# Patient Record
Sex: Male | Born: 1990 | Race: White | Hispanic: No | Marital: Single | Smoking: Current every day smoker
Health system: Southern US, Community
[De-identification: ages and names within clinical notes are randomized; demographics above are authoritative.]

## PROBLEM LIST (undated history)

## (undated) DIAGNOSIS — S42009A Fracture of unspecified part of unspecified clavicle, initial encounter for closed fracture: Secondary | ICD-10-CM

## (undated) DIAGNOSIS — G43909 Migraine, unspecified, not intractable, without status migrainosus: Secondary | ICD-10-CM

## (undated) DIAGNOSIS — J45909 Unspecified asthma, uncomplicated: Secondary | ICD-10-CM

## (undated) HISTORY — DX: Unspecified asthma, uncomplicated: J45.909

## (undated) HISTORY — DX: Migraine, unspecified, not intractable, without status migrainosus: G43.909

## (undated) HISTORY — PX: FRACTURE SURGERY: SHX138

## (undated) HISTORY — DX: Fracture of unspecified part of unspecified clavicle, initial encounter for closed fracture: S42.009A

---
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
## 2009-01-12 ENCOUNTER — Telehealth: Payer: Self-pay | Admitting: Family Medicine

## 2009-01-12 DIAGNOSIS — S02650A Fracture of angle of mandible, unspecified side, initial encounter for closed fracture: Secondary | ICD-10-CM | POA: Insufficient documentation

## 2009-01-13 ENCOUNTER — Ambulatory Visit: Payer: Self-pay | Admitting: Family Medicine

## 2009-01-14 ENCOUNTER — Ambulatory Visit (HOSPITAL_COMMUNITY): Admission: RE | Admit: 2009-01-14 | Discharge: 2009-01-14 | Payer: Self-pay | Admitting: Otolaryngology

## 2009-01-15 ENCOUNTER — Encounter: Payer: Self-pay | Admitting: Family Medicine

## 2009-04-06 ENCOUNTER — Encounter (INDEPENDENT_AMBULATORY_CARE_PROVIDER_SITE_OTHER): Payer: Self-pay | Admitting: *Deleted

## 2009-04-06 ENCOUNTER — Telehealth (INDEPENDENT_AMBULATORY_CARE_PROVIDER_SITE_OTHER): Payer: Self-pay | Admitting: *Deleted

## 2009-04-06 ENCOUNTER — Ambulatory Visit: Payer: Self-pay | Admitting: Family Medicine

## 2009-04-06 DIAGNOSIS — R112 Nausea with vomiting, unspecified: Secondary | ICD-10-CM

## 2009-04-06 LAB — CONVERTED CEMR LAB
Bilirubin Urine: NEGATIVE
Blood in Urine, dipstick: NEGATIVE
Glucose, Urine, Semiquant: NEGATIVE
Ketones, urine, test strip: NEGATIVE
Nitrite: NEGATIVE
Protein, U semiquant: NEGATIVE
Specific Gravity, Urine: 1.02
Urobilinogen, UA: NEGATIVE
WBC Urine, dipstick: NEGATIVE
pH: 6.5

## 2009-12-25 ENCOUNTER — Ambulatory Visit: Payer: Self-pay | Admitting: Family Medicine

## 2009-12-25 DIAGNOSIS — J45909 Unspecified asthma, uncomplicated: Secondary | ICD-10-CM | POA: Insufficient documentation
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
## 2011-05-24 ENCOUNTER — Emergency Department (HOSPITAL_COMMUNITY): Payer: 59

## 2011-05-24 ENCOUNTER — Emergency Department (HOSPITAL_COMMUNITY)
Admission: EM | Admit: 2011-05-24 | Discharge: 2011-05-24 | Disposition: A | Discharge status: Home / Self Care | Payer: 59 | Attending: Emergency Medicine | Admitting: Emergency Medicine

## 2011-05-24 DIAGNOSIS — F17200 Nicotine dependence, unspecified, uncomplicated: Secondary | ICD-10-CM | POA: Insufficient documentation

## 2011-05-24 DIAGNOSIS — S0100XA Unspecified open wound of scalp, initial encounter: Secondary | ICD-10-CM | POA: Insufficient documentation

## 2011-05-24 DIAGNOSIS — S62309A Unspecified fracture of unspecified metacarpal bone, initial encounter for closed fracture: Secondary | ICD-10-CM | POA: Insufficient documentation

## 2011-05-24 DIAGNOSIS — IMO0002 Reserved for concepts with insufficient information to code with codable children: Secondary | ICD-10-CM | POA: Insufficient documentation

## 2011-05-24 DIAGNOSIS — T07XXXA Unspecified multiple injuries, initial encounter: Secondary | ICD-10-CM | POA: Insufficient documentation

## 2011-05-26 ENCOUNTER — Ambulatory Visit (HOSPITAL_BASED_OUTPATIENT_CLINIC_OR_DEPARTMENT_OTHER)
Admission: RE | Admit: 2011-05-26 | Discharge: 2011-05-26 | Disposition: A | Discharge status: Home / Self Care | Payer: Managed Care, Other (non HMO) | Source: Ambulatory Visit | Attending: Orthopedic Surgery | Admitting: Orthopedic Surgery

## 2011-05-26 DIAGNOSIS — S62233A Other displaced fracture of base of first metacarpal bone, unspecified hand, initial encounter for closed fracture: Secondary | ICD-10-CM | POA: Insufficient documentation

## 2011-05-26 DIAGNOSIS — J45909 Unspecified asthma, uncomplicated: Secondary | ICD-10-CM | POA: Insufficient documentation

## 2011-05-26 DIAGNOSIS — F17200 Nicotine dependence, unspecified, uncomplicated: Secondary | ICD-10-CM | POA: Insufficient documentation

## 2011-05-26 DIAGNOSIS — Z01812 Encounter for preprocedural laboratory examination: Secondary | ICD-10-CM | POA: Insufficient documentation

## 2011-05-26 LAB — POCT HEMOGLOBIN-HEMACUE: Hemoglobin: 13.5 g/dL (ref 13.0–17.0)
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
## 2012-05-31 ENCOUNTER — Encounter: Payer: Self-pay | Admitting: Family Medicine

## 2012-05-31 ENCOUNTER — Ambulatory Visit (HOSPITAL_BASED_OUTPATIENT_CLINIC_OR_DEPARTMENT_OTHER)
Admission: RE | Admit: 2012-05-31 | Discharge: 2012-05-31 | Disposition: A | Discharge status: Home / Self Care | Payer: 59 | Source: Ambulatory Visit | Attending: Family Medicine | Admitting: Family Medicine

## 2012-05-31 ENCOUNTER — Ambulatory Visit (INDEPENDENT_AMBULATORY_CARE_PROVIDER_SITE_OTHER): Payer: 59 | Admitting: Family Medicine

## 2012-05-31 VITALS — BP 122/88 | HR 105 | Temp 98.9°F | Wt 153.8 lb

## 2012-05-31 DIAGNOSIS — M545 Low back pain, unspecified: Secondary | ICD-10-CM | POA: Insufficient documentation

## 2012-05-31 DIAGNOSIS — M549 Dorsalgia, unspecified: Secondary | ICD-10-CM

## 2012-05-31 MED ORDER — HYDROCODONE-ACETAMINOPHEN 5-500 MG PO TABS: 1.0000 | ORAL_TABLET | Freq: Four times a day (QID) | ORAL | Status: AC | PRN

## 2012-05-31 MED ORDER — CYCLOBENZAPRINE HCL 10 MG PO TABS: 10.0000 mg | ORAL_TABLET | Freq: Three times a day (TID) | ORAL | Status: AC | PRN

## 2012-05-31 NOTE — Patient Instructions (Signed)
Back Pain, Adult Low back pain is very common. About 1 in 5 people have back pain.The cause of low back pain is rarely dangerous. The pain often gets better over time.About half of people with a sudden onset of back pain feel better in just 2 weeks. About 8 in 10 people feel better by 6 weeks.  CAUSES Some common causes of back pain include:  Strain of the muscles or ligaments supporting the spine.   Wear and tear (degeneration) of the spinal discs.   Arthritis.   Direct injury to the back.  DIAGNOSIS Most of the time, the direct cause of low back pain is not known.However, back pain can be treated effectively even when the exact cause of the pain is unknown.Answering your caregiver's questions about your overall health and symptoms is one of the most accurate ways to make sure the cause of your pain is not dangerous. If your caregiver needs more information, he or she may order lab work or imaging tests (X-rays or MRIs).However, even if imaging tests show changes in your back, this usually does not require surgery. HOME CARE INSTRUCTIONS For many people, back pain returns.Since low back pain is rarely dangerous, it is often a condition that people can learn to manageon their own.   Remain active. It is stressful on the back to sit or stand in one place. Do not sit, drive, or stand in one place for more than 30 minutes at a time. Take short walks on level surfaces as soon as pain allows.Try to increase the length of time you walk each day.   Do not stay in bed.Resting more than 1 or 2 days can delay your recovery.   Do not avoid exercise or work.Your body is made to move.It is not dangerous to be active, even though your back may hurt.Your back will likely heal faster if you return to being active before your pain is gone.   Pay attention to your body when you bend and lift. Many people have less discomfortwhen lifting if they bend their knees, keep the load close to their  bodies,and avoid twisting. Often, the most comfortable positions are those that put less stress on your recovering back.   Find a comfortable position to sleep. Use a firm mattress and lie on your side with your knees slightly bent. If you lie on your back, put a pillow under your knees.   Only take over-the-counter or prescription medicines as directed by your caregiver. Over-the-counter medicines to reduce pain and inflammation are often the most helpful.Your caregiver may prescribe muscle relaxant drugs.These medicines help dull your pain so you can more quickly return to your normal activities and healthy exercise.   Put ice on the injured area.   Put ice in a plastic bag.   Place a towel between your skin and the bag.   Leave the ice on for 15 to 20 minutes, 3 to 4 times a day for the first 2 to 3 days. After that, ice and heat may be alternated to reduce pain and spasms.   Ask your caregiver about trying back exercises and gentle massage. This may be of some benefit.   Avoid feeling anxious or stressed.Stress increases muscle tension and can worsen back pain.It is important to recognize when you are anxious or stressed and learn ways to manage it.Exercise is a great option.  SEEK MEDICAL CARE IF:  You have pain that is not relieved with rest or medicine.   You have   pain that does not improve in 1 week.   You have new symptoms.   You are generally not feeling well.  SEEK IMMEDIATE MEDICAL CARE IF:   You have pain that radiates from your back into your legs.   You develop new bowel or bladder control problems.   You have unusual weakness or numbness in your arms or legs.   You develop nausea or vomiting.   You develop abdominal pain.   You feel faint.  Document Released: 09/05/2005 Document Revised: 08/25/2011 Document Reviewed: 01/24/2011 ExitCare Patient Information 2012 [employer]. 

## 2012-06-03 ENCOUNTER — Encounter: Payer: Self-pay | Admitting: Family Medicine
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
## 2013-08-13 ENCOUNTER — Telehealth: Payer: Self-pay | Admitting: Family Medicine

## 2013-08-13 NOTE — Telephone Encounter (Signed)
Benadryl for itching , calamine---- its viral

## 2013-08-13 NOTE — Telephone Encounter (Signed)
Pt states he thinks has the chicken pox. He has boils that started on his head and have spread down to his waist. Offered patient an appointment tomorrow morning. He wants to know if there is anything we can recommend he do until then since it is spreading. Please advise.

## 2013-08-13 NOTE — Telephone Encounter (Signed)
Please advise      KP 

## 2013-08-13 NOTE — Telephone Encounter (Signed)
Patient has been made aware and voiced understanding. He has agreed to use benadryl and calamine and follow up in the morning.      KP

## 2013-08-14 ENCOUNTER — Encounter: Payer: Self-pay | Admitting: Family Medicine

## 2013-08-14 ENCOUNTER — Ambulatory Visit (HOSPITAL_BASED_OUTPATIENT_CLINIC_OR_DEPARTMENT_OTHER)
Admission: RE | Admit: 2013-08-14 | Discharge: 2013-08-14 | Disposition: A | Discharge status: Home / Self Care | Payer: BC Managed Care – PPO | Source: Ambulatory Visit | Attending: Family Medicine | Admitting: Family Medicine

## 2013-08-14 ENCOUNTER — Ambulatory Visit (INDEPENDENT_AMBULATORY_CARE_PROVIDER_SITE_OTHER): Payer: BC Managed Care – PPO | Admitting: Family Medicine

## 2013-08-14 VITALS — BP 120/70 | HR 111 | Temp 100.5°F | Wt 160.5 lb

## 2013-08-14 DIAGNOSIS — R509 Fever, unspecified: Secondary | ICD-10-CM | POA: Insufficient documentation

## 2013-08-14 DIAGNOSIS — R05 Cough: Secondary | ICD-10-CM

## 2013-08-14 DIAGNOSIS — R21 Rash and other nonspecific skin eruption: Secondary | ICD-10-CM

## 2013-08-14 DIAGNOSIS — B019 Varicella without complication: Secondary | ICD-10-CM

## 2013-08-14 DIAGNOSIS — R059 Cough, unspecified: Secondary | ICD-10-CM

## 2013-08-14 LAB — BASIC METABOLIC PANEL
BUN: 11 mg/dL (ref 6–23)
CO2: 27 mEq/L (ref 19–32)
Chloride: 98 mEq/L (ref 96–112)
Creatinine, Ser: 0.9 mg/dL (ref 0.4–1.5)
Sodium: 130 mEq/L — ABNORMAL LOW (ref 135–145)

## 2013-08-14 LAB — HEPATIC FUNCTION PANEL
ALT: 124 U/L — ABNORMAL HIGH (ref 0–53)
Albumin: 3.8 g/dL (ref 3.5–5.2)
Total Protein: 6.6 g/dL (ref 6.0–8.3)

## 2013-08-14 LAB — CBC WITH DIFFERENTIAL/PLATELET
Basophils Relative: 1.6 % (ref 0.0–3.0)
Eosinophils Relative: 0 % (ref 0.0–5.0)
Lymphs Abs: 2.1 10*3/uL (ref 0.7–4.0)
MCHC: 34.1 g/dL (ref 30.0–36.0)
Monocytes Relative: 13.2 % — ABNORMAL HIGH (ref 3.0–12.0)
Neutrophils Relative %: 42.7 % — ABNORMAL LOW (ref 43.0–77.0)
Platelets: 124 10*3/uL — ABNORMAL LOW (ref 150.0–400.0)
RBC: 4.49 Mil/uL (ref 4.22–5.81)
RDW: 13.6 % (ref 11.5–14.6)

## 2013-08-14 MED ORDER — METHYLPREDNISOLONE ACETATE 80 MG/ML IJ SUSP
80.0000 mg | Freq: Once | INTRAMUSCULAR | Status: AC
  Administered 2013-08-14: 80 mg via INTRAMUSCULAR

## 2013-08-14 MED ORDER — PREDNISONE 10 MG PO TABS: ORAL_TABLET | ORAL | Status: DC

## 2013-08-14 NOTE — Progress Notes (Signed)
  Subjective:     [name] Frey is a 22 y.o. male who presents for evaluation of a rash involving the vesicular rash head to feet , front and back. Rash started 4 days ago. Lesions are pink, and blistering in texture. Rash has changed over time. Rash is pruritic. Associated symptoms: arthralgia, cough, decrease in appetite, fever and myalgia. Patient denies: abdominal pain. Patient has not had contacts with similar rash---but his GF may have shingles. Patient has not had new exposures (soaps, lotions, laundry detergents, foods, medications, plants, insects or animals).  The following portions of the patient's history were reviewed and updated as appropriate: allergies, current medications, past family history, past medical history, past social history, past surgical history and problem list.  Review of Systems Pertinent items are noted in HPI.    Objective:    BP 120/70  Pulse 111  Temp(Src) 100.5 F (38.1 C) (Tympanic)  Wt 160 lb 8 oz (72.802 kg)  SpO2 97% General:  alert, cooperative, appears stated age and mild distress  Skin:  vesicles noted on extremities, face and trunk     Assessment:    varicella    Plan:    Medications: steroids: depo medrol and pred taper. verbal and written patient instruction given. Follow up in a few days. ---if symptoms worsen, fevers increases---go to ER

## 2013-08-14 NOTE — Patient Instructions (Signed)
Chickenpox (Varicella)  Chickenpox (Varicella) is a viral infection that is more common in children. It tends to be a mild illness for most healthy children. It can be more severe in:  · Adults.  · Newborns.  · People with immune system problems.  · People receiving cancer treatment.  CAUSES   Chickenpox is caused by a virus called Varicella-Zoster Virus (VZV). VZV causes both chickenpox and shingles. To get chickenpox, a susceptible person (able to catch an infection) must be exposed to either someone with chickenpox or shingles. A person is susceptible if:  · They have not had the infection before.  · They were not immunized against VZV.  · An immunization did not give complete protection against VZV (breakthrough chickenpox).  Chickenpox is very contagious. It is contagious from 1 to 2 days before the rash appears. It is also contagious until the blisters are crusted. The blisters usually become crusted 3 to 7 days after the rash begins. It usually takes about 2 weeks before symptoms show up.  SYMPTOMS   Typical chickenpox symptoms include:  · Fever.  · Headache.  · Poor appetite.  · An itchy rash that changes over time:  · It starts as red spots that become bumps.  · Bumps become blisters.  · Blisters turn into scabs.  Breakthrough chickenpox happens when an immunized person still gets chickenpox. The symptoms are less severe. The rash may only be red spots or bumps, with no blisters or scabs. Fever may be low or absent.  DIAGNOSIS   Typical chickenpox is diagnosed by physical exam. A blood test can confirm the diagnosis, when the disease is not certain.  TREATMENT   Most of the time, in healthy children, only home treatments are needed. In some cases, in the early stages of chickenpox, your caregiver may prescribe antiviral medicines. These medicines may decrease the severity of the illness and prevent complications. In the rare complicated case, treatment in the hospital is needed. Intravenous (IV) medicine  and other treatments can be given in the hospital. Your caregiver may prescribe medicine to relieve itching.  To prevent the spread of chickenpox to at risk people, your caregiver may prescribe:  · Immunization.  · Antiviral medicine.  · Immune globulin.  HOME CARE INSTRUCTIONS   · For fever:  · Do not give aspirin to children. This could lead to brain and liver damage through Reye's syndrome. Read the label on over-the-counter medicines used.  · Only take over-the-counter or prescription medicines for pain, discomfort or fever as directed by your caregiver.  · For itching:  · If your caregiver prescribed medicine, take as directed.  · You may use plain calamine lotion on the itching sores. Follow the directions on the label. Do not use on sores in the mouth.  · Avoid scratching the rash or picking off the scabs. Keep fingernails cut short and clean. Put cotton gloves or socks on your child's hands at night.  · Keep a child with chickenpox quiet and cool. Sweating and overheating makes itching worse. Stay out of the sun.  · Cool compresses may be applied to itchy areas.  · Cool water baths with baking soda or oatmeal soap may help.  · For painful sores in the mouth; pain medicine and cold foods, like frozen pops, may feel good.  · Drink plenty of fluids. Avoid salty or acidic liquids (tomato or orange juice). These irritate mouth sores and cause pain.  · People with chickenpox should avoid exposure (being   in the same room) with:  · Pregnant women (especially if they have not had chickenpox or been immunized against it).  · Young infants.  · People receiving cancer treatments or long-term steroids.  · People with immune system problems.  · The elderly.  · Any child or adult with chickenpox should stay home until all blisters have crusted. If there are no blisters, the child or adult should stay home until no new spots show up.  SEEK MEDICAL CARE IF:   · You or your child has an oral temperature above 102° F (38.9°  C).  · Your baby is older than 3 months with a rectal temperature of 100.5° F (38.1° C) or higher for more than 1 day.  · The sores are infected. Look for:  · Swelling.  · Increasing redness.  · Red streaks.  · Tenderness.  · Yellow or green pus coming from blisters.  · Cough.  · New symptoms develop that concern you.  SEEK IMMEDIATE MEDICAL CARE IF:   You or your child develops:  · Vomiting.  · Confusion, unusual sleepiness or odd behavior.  · Neck stiffness.  · Seizures (convulsions).  · Loss of balance.  · Chest pain.  · Trouble breathing or fast breathing.  · Blood in urine.  · Rectal bleeding.  · Bruising of the skin or bleeding in the blisters.  · Blisters in the eye.  · Eye pain, redness or decreased vision.  · You or your child has an oral temperature above 102° F (38.9° C), not controlled by medicine.  · Your baby is older than 3 months with a rectal temperature of 102° F (38.9° C) or higher.  · Your baby is 3 months old or younger with a rectal temperature of 100.4° F (38° C) or higher.  MAKE SURE YOU:   · Understand these instructions.  · Will watch your condition.  · Will get help right away if you are not doing well or get worse.  Document Released: 09/02/2000 Document Revised: 11/28/2011 Document Reviewed: 03/20/2008  Exit[employer]® Patient Information ©2014 [employer].

## 2013-08-14 NOTE — Progress Notes (Signed)
Pre visit review using our clinic review tool, if applicable. No additional management support is needed unless otherwise documented below in the visit note. 

## 2013-08-16 ENCOUNTER — Other Ambulatory Visit: Payer: Self-pay

## 2013-08-16 DIAGNOSIS — D7289 Other specified disorders of white blood cells: Secondary | ICD-10-CM

## 2013-08-21 ENCOUNTER — Other Ambulatory Visit (INDEPENDENT_AMBULATORY_CARE_PROVIDER_SITE_OTHER): Payer: BC Managed Care – PPO

## 2013-08-21 DIAGNOSIS — D7289 Other specified disorders of white blood cells: Secondary | ICD-10-CM

## 2013-08-21 LAB — CBC WITH DIFFERENTIAL/PLATELET
HCT: 45 % (ref 39.0–52.0)
Hemoglobin: 15.1 g/dL (ref 13.0–17.0)
Lymphocytes Relative: 30.3 % (ref 12.0–46.0)
MCHC: 33.6 g/dL (ref 30.0–36.0)
Monocytes Absolute: 1.1 10*3/uL — ABNORMAL HIGH (ref 0.1–1.0)
RBC: 4.87 Mil/uL (ref 4.22–5.81)
RDW: 13.8 % (ref 11.5–14.6)

## 2013-08-21 LAB — HEPATIC FUNCTION PANEL
ALT: 43 U/L (ref 0–53)
Bilirubin, Direct: 0 mg/dL (ref 0.0–0.3)

## 2016-06-28 ENCOUNTER — Emergency Department (HOSPITAL_BASED_OUTPATIENT_CLINIC_OR_DEPARTMENT_OTHER)
Admission: EM | Admit: 2016-06-28 | Discharge: 2016-06-28 | Disposition: A | Discharge status: Home / Self Care | Payer: Self-pay | Attending: Emergency Medicine | Admitting: Emergency Medicine

## 2016-06-28 ENCOUNTER — Encounter (HOSPITAL_BASED_OUTPATIENT_CLINIC_OR_DEPARTMENT_OTHER): Payer: Self-pay | Admitting: *Deleted

## 2016-06-28 ENCOUNTER — Emergency Department (HOSPITAL_BASED_OUTPATIENT_CLINIC_OR_DEPARTMENT_OTHER): Payer: Self-pay

## 2016-06-28 DIAGNOSIS — M79672 Pain in left foot: Secondary | ICD-10-CM

## 2016-06-28 DIAGNOSIS — S93401A Sprain of unspecified ligament of right ankle, initial encounter: Secondary | ICD-10-CM | POA: Insufficient documentation

## 2016-06-28 DIAGNOSIS — F17210 Nicotine dependence, cigarettes, uncomplicated: Secondary | ICD-10-CM | POA: Insufficient documentation

## 2016-06-28 DIAGNOSIS — Y929 Unspecified place or not applicable: Secondary | ICD-10-CM | POA: Insufficient documentation

## 2016-06-28 DIAGNOSIS — X501XXA Overexertion from prolonged static or awkward postures, initial encounter: Secondary | ICD-10-CM | POA: Insufficient documentation

## 2016-06-28 DIAGNOSIS — J45909 Unspecified asthma, uncomplicated: Secondary | ICD-10-CM | POA: Insufficient documentation

## 2016-06-28 DIAGNOSIS — L03116 Cellulitis of left lower limb: Secondary | ICD-10-CM | POA: Insufficient documentation

## 2016-06-28 DIAGNOSIS — Y999 Unspecified external cause status: Secondary | ICD-10-CM | POA: Insufficient documentation

## 2016-06-28 DIAGNOSIS — Y9301 Activity, walking, marching and hiking: Secondary | ICD-10-CM | POA: Insufficient documentation

## 2016-06-28 MED ORDER — SULFAMETHOXAZOLE-TRIMETHOPRIM 800-160 MG PO TABS: 1.0000 | ORAL_TABLET | Freq: Two times a day (BID) | ORAL | 0 refills | Status: AC

## 2016-06-28 MED ORDER — CEPHALEXIN 500 MG PO CAPS: 500.0000 mg | ORAL_CAPSULE | Freq: Three times a day (TID) | ORAL | 0 refills | Status: AC

## 2016-06-28 NOTE — ED Notes (Signed)
Patient transported to X-ray 
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
## 2016-07-30 ENCOUNTER — Encounter (HOSPITAL_COMMUNITY): Payer: Self-pay | Admitting: Emergency Medicine

## 2016-07-30 ENCOUNTER — Emergency Department (HOSPITAL_COMMUNITY): Payer: Self-pay

## 2016-07-30 ENCOUNTER — Emergency Department (HOSPITAL_COMMUNITY)
Admission: EM | Admit: 2016-07-30 | Discharge: 2016-07-30 | Disposition: A | Discharge status: Home / Self Care | Payer: Self-pay | Attending: Emergency Medicine | Admitting: Emergency Medicine

## 2016-07-30 DIAGNOSIS — F17210 Nicotine dependence, cigarettes, uncomplicated: Secondary | ICD-10-CM | POA: Insufficient documentation

## 2016-07-30 DIAGNOSIS — S0083XA Contusion of other part of head, initial encounter: Secondary | ICD-10-CM

## 2016-07-30 DIAGNOSIS — Y999 Unspecified external cause status: Secondary | ICD-10-CM | POA: Insufficient documentation

## 2016-07-30 DIAGNOSIS — R42 Dizziness and giddiness: Secondary | ICD-10-CM | POA: Insufficient documentation

## 2016-07-30 DIAGNOSIS — Y939 Activity, unspecified: Secondary | ICD-10-CM | POA: Insufficient documentation

## 2016-07-30 DIAGNOSIS — J45909 Unspecified asthma, uncomplicated: Secondary | ICD-10-CM | POA: Insufficient documentation

## 2016-07-30 DIAGNOSIS — Y929 Unspecified place or not applicable: Secondary | ICD-10-CM | POA: Insufficient documentation

## 2016-07-30 DIAGNOSIS — S0012XA Contusion of left eyelid and periocular area, initial encounter: Secondary | ICD-10-CM | POA: Insufficient documentation

## 2016-07-30 NOTE — ED Notes (Signed)
Patient denies pain and is resting comfortably.  
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
## 2016-07-30 NOTE — ED Notes (Signed)
Pupils are equal and reactive to light and accommodation.
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
## 2020-02-06 ENCOUNTER — Emergency Department (HOSPITAL_COMMUNITY): Payer: Self-pay

## 2020-02-06 ENCOUNTER — Emergency Department (HOSPITAL_COMMUNITY)
Admission: EM | Admit: 2020-02-06 | Discharge: 2020-02-06 | Disposition: A | Discharge status: Home / Self Care | Payer: Self-pay | Attending: Emergency Medicine | Admitting: Emergency Medicine

## 2020-02-06 ENCOUNTER — Encounter (HOSPITAL_COMMUNITY): Payer: Self-pay | Admitting: Emergency Medicine

## 2020-02-06 ENCOUNTER — Other Ambulatory Visit: Payer: Self-pay

## 2020-02-06 DIAGNOSIS — R112 Nausea with vomiting, unspecified: Secondary | ICD-10-CM | POA: Insufficient documentation

## 2020-02-06 DIAGNOSIS — Z79899 Other long term (current) drug therapy: Secondary | ICD-10-CM | POA: Insufficient documentation

## 2020-02-06 DIAGNOSIS — J45909 Unspecified asthma, uncomplicated: Secondary | ICD-10-CM | POA: Insufficient documentation

## 2020-02-06 DIAGNOSIS — R079 Chest pain, unspecified: Secondary | ICD-10-CM | POA: Insufficient documentation

## 2020-02-06 DIAGNOSIS — R197 Diarrhea, unspecified: Secondary | ICD-10-CM | POA: Insufficient documentation

## 2020-02-06 DIAGNOSIS — F17210 Nicotine dependence, cigarettes, uncomplicated: Secondary | ICD-10-CM | POA: Insufficient documentation

## 2020-02-06 LAB — COMPREHENSIVE METABOLIC PANEL
ALT: 34 U/L (ref 0–44)
AST: 28 U/L (ref 15–41)
Albumin: 4.5 g/dL (ref 3.5–5.0)
Alkaline Phosphatase: 52 U/L (ref 38–126)
Anion gap: 9 (ref 5–15)
BUN: 11 mg/dL (ref 6–20)
CO2: 23 mmol/L (ref 22–32)
Calcium: 9.1 mg/dL (ref 8.9–10.3)
Chloride: 106 mmol/L (ref 98–111)
Creatinine, Ser: 0.89 mg/dL (ref 0.61–1.24)
GFR calc Af Amer: >60 mL/min (ref >60)
GFR calc non Af Amer: >60 mL/min (ref >60)
Glucose, Bld: 103 mg/dL — ABNORMAL HIGH (ref 70–99)
Potassium: 4.2 mmol/L (ref 3.5–5.1)
Sodium: 138 mmol/L (ref 135–145)
Total Bilirubin: 0.6 mg/dL (ref 0.3–1.2)
Total Protein: 7.3 g/dL (ref 6.5–8.1)

## 2020-02-06 LAB — URINALYSIS, ROUTINE W REFLEX MICROSCOPIC
Bilirubin Urine: NEGATIVE
Glucose, UA: NEGATIVE mg/dL
Hgb urine dipstick: NEGATIVE
Ketones, ur: NEGATIVE mg/dL
Leukocytes,Ua: NEGATIVE
Nitrite: NEGATIVE
Protein, ur: NEGATIVE mg/dL
Specific Gravity, Urine: 1.02 (ref 1.005–1.030)
pH: 7 (ref 5.0–8.0)

## 2020-02-06 LAB — CBC
HCT: 42.4 % (ref 39.0–52.0)
Hemoglobin: 14.1 g/dL (ref 13.0–17.0)
MCH: 31.2 pg (ref 26.0–34.0)
MCHC: 33.3 g/dL (ref 30.0–36.0)
MCV: 93.8 fL (ref 80.0–100.0)
Platelets: 317 10*3/uL (ref 150–400)
RBC: 4.52 MIL/uL (ref 4.22–5.81)
RDW: 13 % (ref 11.5–15.5)
WBC: 8 10*3/uL (ref 4.0–10.5)
nRBC: 0 % (ref 0.0–0.2)

## 2020-02-06 LAB — LIPASE, BLOOD: Lipase: 27 U/L (ref 11–51)

## 2020-02-06 MED ORDER — SODIUM CHLORIDE 0.9 % IV BOLUS
1000.0000 mL | Freq: Once | INTRAVENOUS | Status: AC
  Administered 2020-02-06: 1000 mL via INTRAVENOUS

## 2020-02-06 MED ORDER — FAMOTIDINE 20 MG PO TABS: 20.0000 mg | ORAL_TABLET | Freq: Two times a day (BID) | ORAL | 0 refills | Status: DC

## 2020-02-06 MED ORDER — DICYCLOMINE HCL 10 MG/ML IM SOLN
20.0000 mg | Freq: Once | INTRAMUSCULAR | Status: AC
  Administered 2020-02-06: 20 mg via INTRAMUSCULAR
  Filled 2020-02-06: qty 2

## 2020-02-06 MED ORDER — SODIUM CHLORIDE 0.9% FLUSH
3.0000 mL | Freq: Once | INTRAVENOUS | Status: AC
  Administered 2020-02-06: 3 mL via INTRAVENOUS

## 2020-02-06 MED ORDER — ONDANSETRON 4 MG PO TBDP: ORAL_TABLET | ORAL | 0 refills | Status: DC

## 2020-02-06 MED ORDER — DICYCLOMINE HCL 20 MG PO TABS: 20.0000 mg | ORAL_TABLET | Freq: Two times a day (BID) | ORAL | 0 refills | Status: DC

## 2020-02-06 MED ORDER — ONDANSETRON HCL 4 MG/2ML IJ SOLN
4.0000 mg | Freq: Once | INTRAMUSCULAR | Status: AC
  Administered 2020-02-06: 4 mg via INTRAVENOUS
  Filled 2020-02-06: qty 2

## 2020-02-06 MED ORDER — FAMOTIDINE IN NACL 20-0.9 MG/50ML-% IV SOLN
20.0000 mg | Freq: Once | INTRAVENOUS | Status: AC
  Administered 2020-02-06: 20 mg via INTRAVENOUS
  Filled 2020-02-06: qty 50
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
## 2020-02-06 NOTE — ED Notes (Signed)
Pt given urinal and told we need a urine sample.

## 2020-02-06 NOTE — Discharge Instructions (Signed)
Your work-up today is very reassuring and symptoms are likely due to foodborne illness or a viral GI bug.  Use prescribed medications to help with vomiting, stomach irritation and pain.  Zofran -  as needed for nausea and vomiting Pepcid - twice daily to help with acid reduction and stomach irritation Bentyl - as needed for abdominal cramping  Start with clear liquids today, and advance your diet slowly to bland foods.  Often times the stomach bugs are short-lived and hopefully will be feeling better in the next 2 days.  If you develop persistent or worsening symptoms, develop focal abdominal pain, fevers or any other new or concerning symptoms return for reevaluation.

## 2020-02-06 NOTE — ED Provider Notes (Signed)
[facility]-EMERGENCY DEPT Provider Note   CSN: [id number] Arrival date & time: 02/06/20  1223     History Chief Complaint  Patient presents with  . Emesis  . Diarrhea  . Chest Pain    [name] Frey is a 29 y.o. male.  [name] Frey is a 29 y.o. male with history of asthma, and migraines, who presents to the ED for evaluation of nausea, vomiting, and diarrhea that began at 10 AM this morning.  He reports he woke up, initially drank a cup of coffee and was feeling fine and then started to feel very poorly.  He reports associated chills and multiple episodes of nonbloody emesis.  He has had a few loose stools, has not noted any blood in the stool.  He reports intermittent cramping abdominal discomfort as well as some epigastric discomfort that intermittently radiates up into his chest.  He reports chest discomfort started after patient had multiple episodes of dry heaving, he reports this is now primarily resolved.  No associated shortness of breath.  He has not had any fevers or chills.  He reports he has been unable to eat last night and his roommate reported he was having some upset stomach and not feeling well also, patient wonders whether he could have food poisoning.  He denies any other known sick contacts.  Has not taken any medicine prior to arrival.  No other aggravating or alleviating factors.        Past Medical History:  Diagnosis Date  . Asthma   . Collar bone fracture   . Migraines     Patient Active Problem List   Diagnosis Date Noted  . ASTHMA 12/25/2009  . NAUSEA AND VOMITING 04/06/2009  . CLOSED FRACTURE OF ANGLE OF JAW 01/12/2009  . MIGRAINE, COMMON 09/10/2007    Past Surgical History:  Procedure Laterality Date  . FRACTURE SURGERY         Family History  Problem Relation Age of Onset  . Hypertension Mother   . Other Brother        Epilepsy  . Sleep apnea Father     Social History   Tobacco Use  . Smoking status: Current  Every Day Smoker    Packs/day: 0.50    Years: 3.00    Pack years: 1.50    Types: Cigarettes  . Smokeless tobacco: Never Used  Substance Use Topics  . Alcohol use: Yes  . Drug use: Yes    Types: Marijuana    Comment: opiates    Home Medications Prior to Admission medications   Medication Sig Start Date End Date Taking? Authorizing Provider  acetaminophen (TYLENOL) 500 MG tablet Take 500-1,000 mg by mouth as needed for mild pain or headache.   Yes [provider]  diphenhydrAMINE (BENADRYL) 25 MG tablet Take 25 mg by mouth every 6 (six) hours as needed.   Yes [provider]  PARoxetine (PAXIL) 20 MG tablet Take 20 mg by mouth daily. 01/13/20  Yes [provider]  dicyclomine (BENTYL) 20 MG tablet Take 1 tablet (20 mg total) by mouth 2 (two) times daily. 02/06/20   Dartha Lodge, PA-C  famotidine (PEPCID) 20 MG tablet Take 1 tablet (20 mg total) by mouth 2 (two) times daily. 02/06/20   Dartha Lodge, PA-C  ondansetron (ZOFRAN ODT) 4 MG disintegrating tablet 4mg  ODT q4 hours prn nausea/vomit 02/06/20   02/08/20, PA-C  predniSONE (DELTASONE) 10 MG tablet 3 po qd for 3 days then  2 po qd for 3 days the 1 po qd for 3 days Patient not taking: Reported on 02/06/2020 08/14/13   Donato Schultz, DO    Allergies    Patient has no known allergies.  Review of Systems   Review of Systems  Constitutional: Negative for chills and fever.  HENT: Negative.   Respiratory: Negative for cough and shortness of breath.   Cardiovascular: Positive for chest pain.  Gastrointestinal: Positive for abdominal pain, diarrhea, nausea and vomiting. Negative for blood in stool and constipation.  Genitourinary: Negative for dysuria and frequency.  Musculoskeletal: Negative for arthralgias and myalgias.  Skin: Negative for color change and rash.  Neurological: Negative for dizziness, syncope, light-headedness and headaches.  All other systems reviewed and are  negative.   Physical Exam Updated Vital Signs BP (!) 134/96   Pulse 75   Temp 97.8 F (36.6 C) (Oral)   Resp 13   SpO2 96%   Physical Exam Vitals and nursing note reviewed.  Constitutional:      General: He is not in acute distress.    Appearance: He is well-developed. He is not ill-appearing or diaphoretic.  HENT:     Head: Normocephalic and atraumatic.  Eyes:     General:        Right eye: No discharge.        Left eye: No discharge.     Pupils: Pupils are equal, round, and reactive to light.  Cardiovascular:     Rate and Rhythm: Normal rate and regular rhythm.     Pulses:          Radial pulses are 2+ on the right side and 2+ on the left side.     Heart sounds: Normal heart sounds. No murmur. No friction rub. No gallop.   Pulmonary:     Effort: Pulmonary effort is normal. No respiratory distress.     Breath sounds: Normal breath sounds. No wheezing or rales.     Comments: Respirations equal and unlabored, patient able to speak in full sentences, lungs clear to auscultation bilaterally Abdominal:     General: Bowel sounds are normal. There is no distension.     Palpations: Abdomen is soft. There is no mass.     Tenderness: There is no abdominal tenderness. There is no guarding.     Comments: Abdomen soft, nondistended, nontender to palpation in all quadrants without guarding or peritoneal signs  Musculoskeletal:        General: No deformity.     Cervical back: Neck supple.     Right lower leg: No edema.  Skin:    General: Skin is warm and dry.     Capillary Refill: Capillary refill takes less than 2 seconds.  Neurological:     Mental Status: He is alert.     Coordination: Coordination normal.     Comments: Speech is clear, able to follow commands Moves extremities without ataxia, coordination intact  Psychiatric:        Mood and Affect: Mood normal.        Behavior: Behavior normal.     ED Results / Procedures / Treatments   Labs (all labs ordered are  listed, but only abnormal results are displayed) Labs Reviewed  COMPREHENSIVE METABOLIC PANEL - Abnormal; Notable for the following components:      Result Value   Glucose, Bld 103 (*)    All other components within normal limits  LIPASE, BLOOD  CBC  URINALYSIS, ROUTINE W REFLEX MICROSCOPIC  EKG None  Radiology DG Chest Port 1 View  Result Date: 02/06/2020 CLINICAL DATA:  Chest pain beginning today with headache. EXAM: PORTABLE CHEST 1 VIEW COMPARISON:  08/14/2013 FINDINGS: Lungs are adequately inflated and otherwise clear. Cardiomediastinal silhouette and remainder of the exam is unchanged. IMPRESSION: No active disease. Electronically Signed   By: Marin Olp M.D.   On: 02/06/2020 14:00    Procedures Procedures (including critical care time)  Medications Ordered in ED Medications  sodium chloride flush (NS) 0.9 % injection 3 mL (3 mLs Intravenous Given 02/06/20 1302)  sodium chloride 0.9 % bolus 1,000 mL (0 mLs Intravenous Stopped 02/06/20 1447)  ondansetron (ZOFRAN) injection 4 mg (4 mg Intravenous Given 02/06/20 1317)  famotidine (PEPCID) IVPB 20 mg premix (0 mg Intravenous Stopped 02/06/20 1447)  dicyclomine (BENTYL) injection 20 mg (20 mg Intramuscular Given 02/06/20 1319)    ED Course  I have reviewed the triage vital signs and the nursing notes.  Pertinent labs & imaging results that were available during my care of the patient were reviewed by me and considered in my medical decision making (see chart for details).    MDM Rules/Calculators/A&P                      Patient presents to the ED with complaints of nausea, vomiting, diarrhea with some intermittent abdominal pains.  Reports after an episode of dry heaving he had some chest pain that has now almost completely resolved.  Patient nontoxic appearing, in no apparent distress, vitals WNL. On exam abdomen is nontender to palpation, no peritoneal signs. Will evaluate with labs and given reported chest pain will get  EKG and chest x-ray but do not think any abdominal imaging is indicated given reassuring exam.  Zofran, Pepcid, Bentyl and fluids administered.   I have independently ordered, reviewed and interpreted all labs and imaging: CBC: No leukocytosis, normal hemoglobin CMP: Glucose 103, no significant electrolyte derangements, normal renal and liver function Lipase: WNL UA: No hematuria or signs of infection EKG: Sinus rhythm without ischemic changes or arrhythmia CXR: No active cardiopulmonary disease  On repeat abdominal exam patient remains without peritoneal signs, doubt cholecystitis, pancreatitis, diverticulitis, appendicitis, bowel obstruction/perforation. Patient tolerating PO in the emergency department. Will discharge home with supportive measures. I discussed results, treatment plan, need for PCP follow-up, and return precautions with the patient. Provided opportunity for questions, patient confirmed understanding and is in agreement with plan.    Final Clinical Impression(s) / ED Diagnoses Final diagnoses:  Nausea vomiting and diarrhea    Rx / DC Orders ED Discharge Orders         Ordered    ondansetron (ZOFRAN ODT) 4 MG disintegrating tablet     02/06/20 1443    famotidine (PEPCID) 20 MG tablet  2 times daily     02/06/20 1443    dicyclomine (BENTYL) 20 MG tablet  2 times daily     02/06/20 1443           Jacqlyn Larsen, [place] 02/07/20 7824    Varney Biles, MD 02/09/20 2252

## 2020-02-21 ENCOUNTER — Emergency Department (HOSPITAL_COMMUNITY): Payer: Self-pay

## 2020-02-21 ENCOUNTER — Encounter (HOSPITAL_COMMUNITY): Payer: Self-pay

## 2020-02-21 ENCOUNTER — Other Ambulatory Visit: Payer: Self-pay

## 2020-02-21 DIAGNOSIS — R0789 Other chest pain: Secondary | ICD-10-CM | POA: Insufficient documentation

## 2020-02-21 DIAGNOSIS — F17210 Nicotine dependence, cigarettes, uncomplicated: Secondary | ICD-10-CM | POA: Insufficient documentation

## 2020-02-21 DIAGNOSIS — J45909 Unspecified asthma, uncomplicated: Secondary | ICD-10-CM | POA: Insufficient documentation

## 2020-02-21 DIAGNOSIS — R252 Cramp and spasm: Secondary | ICD-10-CM | POA: Insufficient documentation

## 2020-02-21 LAB — CBC
HCT: 43.9 % (ref 39.0–52.0)
Hemoglobin: 14.7 g/dL (ref 13.0–17.0)
MCH: 30.9 pg (ref 26.0–34.0)
MCHC: 33.5 g/dL (ref 30.0–36.0)
MCV: 92.2 fL (ref 80.0–100.0)
Platelets: 358 10*3/uL (ref 150–400)
RBC: 4.76 MIL/uL (ref 4.22–5.81)
RDW: 12.9 % (ref 11.5–15.5)
WBC: 6.8 10*3/uL (ref 4.0–10.5)
nRBC: 0 % (ref 0.0–0.2)

## 2020-02-21 LAB — BASIC METABOLIC PANEL
Anion gap: 10 (ref 5–15)
BUN: 12 mg/dL (ref 6–20)
CO2: 26 mmol/L (ref 22–32)
Calcium: 9.2 mg/dL (ref 8.9–10.3)
Chloride: 100 mmol/L (ref 98–111)
Creatinine, Ser: 0.94 mg/dL (ref 0.61–1.24)
GFR calc Af Amer: >60 mL/min (ref >60)
GFR calc non Af Amer: >60 mL/min (ref >60)
Glucose, Bld: 90 mg/dL (ref 70–99)
Potassium: 3.9 mmol/L (ref 3.5–5.1)
Sodium: 136 mmol/L (ref 135–145)

## 2020-02-21 LAB — TROPONIN I (HIGH SENSITIVITY)
Troponin I (High Sensitivity): <2 ng/L (ref <18)
Troponin I (High Sensitivity): <2 ng/L (ref <18)

## 2020-02-21 MED ORDER — SODIUM CHLORIDE 0.9% FLUSH: 3.0000 mL | Freq: Once | INTRAVENOUS | Status: DC

## 2020-02-21 NOTE — ED Triage Notes (Addendum)
Pt reports chest pain, cramping, SOB and shaking starting yesterday. 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 Denies vomiting. He states that he also had his first COVID vaccine on Tuesday and recently started lisinopril.

## 2020-02-22 ENCOUNTER — Emergency Department (HOSPITAL_COMMUNITY)
Admission: EM | Admit: 2020-02-22 | Discharge: 2020-02-22 | Disposition: A | Discharge status: Home / Self Care | Payer: Self-pay | Attending: Emergency Medicine | Admitting: Emergency Medicine

## 2020-02-22 DIAGNOSIS — R0789 Other chest pain: Secondary | ICD-10-CM

## 2020-02-22 DIAGNOSIS — R252 Cramp and spasm: Secondary | ICD-10-CM

## 2020-02-22 LAB — HEPATIC FUNCTION PANEL
ALT: 28 U/L (ref 0–44)
AST: 26 U/L (ref 15–41)
Albumin: 4.6 g/dL (ref 3.5–5.0)
Alkaline Phosphatase: 60 U/L (ref 38–126)
Bilirubin, Direct: <0.1 mg/dL (ref 0.0–0.2)
Total Bilirubin: 0.4 mg/dL (ref 0.3–1.2)
Total Protein: 7.6 g/dL (ref 6.5–8.1)

## 2020-02-22 LAB — D-DIMER, QUANTITATIVE: D-Dimer, Quant: 0.31 ug/mL-FEU (ref 0.00–0.50)

## 2020-02-22 LAB — LIPASE, BLOOD: Lipase: 27 U/L (ref 11–51)

## 2020-02-22 LAB — CK: Total CK: 158 U/L (ref 49–397)

## 2020-02-22 LAB — MAGNESIUM: Magnesium: 2.4 mg/dL (ref 1.7–2.4)

## 2020-02-22 MED ORDER — IBUPROFEN 800 MG PO TABS
800.0000 mg | ORAL_TABLET | Freq: Once | ORAL | Status: AC
  Administered 2020-02-22: 800 mg via ORAL
  Filled 2020-02-22: qty 1

## 2020-02-22 NOTE — ED Notes (Signed)
Pt tolerating PO fluids

## 2020-02-22 NOTE — ED Provider Notes (Signed)
TIME SEEN: 12:39 AM  CHIEF COMPLAINT: Chest pain, abdominal pain, back pain  HPI: Patient is a 29 year old male with history of migraines, asthma who presents to the emergency department with a tense, tight feeling in his chest, upper abdomen and back that started this morning when he woke up.  States he initially felt like he was "stiff".  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  He states he did increase his fluid intake.  He denies any fevers, cough, nausea, vomiting or diarrhea.  He states his pain has improved but still having some chest discomfort.  He did have a fracture of the lower extremity and surgery about 3 months ago.  No history of PE or DVT.  No aggravating or alleviating factors to his pain.  He states he was also concerned this could have been related to starting lisinopril recently and having his first Covid vaccination a couple of days ago.  ROS: See HPI Constitutional: no fever  Eyes: no drainage  ENT: no runny nose   Cardiovascular:   chest pain  Resp: no SOB  GI: no vomiting GU: no dysuria Integumentary: no rash  Allergy: no hives  Musculoskeletal: no leg swelling  Neurological: no slurred speech ROS otherwise negative  PAST MEDICAL HISTORY/PAST SURGICAL HISTORY:  Past Medical History:  Diagnosis Date  . Asthma   . Collar bone fracture   . Migraines     MEDICATIONS:  Prior to Admission medications   Medication Sig Start Date End Date Taking? Authorizing Provider  acetaminophen (TYLENOL) 500 MG tablet Take 500-1,000 mg by mouth as needed for mild pain or headache.    [provider]  dicyclomine (BENTYL) 20 MG tablet Take 1 tablet (20 mg total) by mouth 2 (two) times daily. 02/06/20   Dartha Lodge, PA-C  diphenhydrAMINE (BENADRYL) 25 MG tablet Take 25 mg by mouth every 6 (six) hours as needed.    [provider]  famotidine (PEPCID) 20 MG tablet Take 1 tablet (20  mg total) by mouth 2 (two) times daily. 02/06/20   Dartha Lodge, PA-C  ondansetron (ZOFRAN ODT) 4 MG disintegrating tablet 4mg  ODT q4 hours prn nausea/vomit 02/06/20   02/08/20, PA-C  PARoxetine (PAXIL) 20 MG tablet Take 20 mg by mouth daily. 01/13/20   [provider]  predniSONE (DELTASONE) 10 MG tablet 3 po qd for 3 days then 2 po qd for 3 days the 1 po qd for 3 days Patient not taking: Reported on 02/06/2020 08/14/13   08/16/13, DO    ALLERGIES:  No Known Allergies  SOCIAL HISTORY:  Social History   Tobacco Use  . Smoking status: Current Every Day Smoker    Packs/day: 0.50    Years: 3.00    Pack years: 1.50    Types: Cigarettes  . Smokeless tobacco: Never Used  Substance Use Topics  . Alcohol use: Yes    FAMILY HISTORY: Family History  Problem Relation Age of Onset  . Hypertension Mother   . Other Brother        Epilepsy  . Sleep apnea Father     EXAM: BP (!) 144/91 (BP Location: Right Arm)   Pulse 77   Temp 98.7 F (37.1 C)   Resp 18   SpO2 100%  CONSTITUTIONAL: Alert and oriented and responds appropriately to questions. Well-appearing; well-nourished HEAD: Normocephalic EYES: Conjunctivae clear, pupils appear equal, EOM  appear intact ENT: normal nose; moist mucous membranes NECK: Supple, normal ROM CARD: RRR; S1 and S2 appreciated; no murmurs, no clicks, no rubs, no gallops RESP: Normal chest excursion without splinting or tachypnea; breath sounds clear and equal bilaterally; no wheezes, no rhonchi, no rales, no hypoxia or respiratory distress, speaking full sentences ABD/GI: Normal bowel sounds; non-distended; soft, non-tender, no rebound, no guarding, no peritoneal signs, no hepatosplenomegaly BACK:  The back appears normal EXT: Normal ROM in all joints; no deformity noted, no edema; no cyanosis, no calf tenderness or calf swelling SKIN: Normal color for age and race; warm; no rash on exposed skin NEURO: Moves all extremities  equally PSYCH: The patient's mood and manner are appropriate.   MEDICAL DECISION MAKING: Patient here with chest pain, back pain, abdominal pain that has improved.  Still having some chest discomfort.  His EKG has been reviewed/interpreted and shows no ischemia, arrhythmia or interval change.  Labs obtained in triage reviewed/interpreted and show no acute abnormality including normal hemoglobin, electrolytes and to normal high-sensitivity troponins.  Low suspicion that this is related to his COVID-19 vaccination or lisinopril.  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  Have offered IV fluids but patient would prefer oral fluids which I feel is reasonable given he is not vomiting or having diarrhea.  Abdominal exam today is benign.  Doubt appendicitis, cholecystitis, colitis, diverticulitis, bowel obstruction, perforation.  No urinary symptoms and no back pain currently.  Doubt kidney stone, pyelonephritis, UTI.  Will obtain LFTs, lipase, CK level,.  He is also at risk for PE given recent fracture and surgery.  Will obtain D-dimer.  ED PROGRESS: Patient's labs appear reassuring.  Normal potassium and magnesium.  D-dimer negative.  LFTs and lipase normal.  CK level normal.  Given ibuprofen for discomfort he states he is feeling better.  Have encouraged him to still get his second Covid vaccine and continue his lisinopril as prescribed.  Recommended increase fluid intake at home.  I feel he is safe for discharge.   At this time, I do not feel there is any life-threatening condition present. I have reviewed, interpreted and discussed all results (EKG, imaging, lab, urine as appropriate) and exam findings with patient/family. I have reviewed nursing notes and appropriate previous records.  I feel the patient is safe to be discharged home without further emergent workup and can continue workup as an outpatient as needed. Discussed usual  and customary return precautions. Patient/family verbalize understanding and are comfortable with this plan.  Outpatient follow-up has been provided as needed. All questions have been answered.     EKG Interpretation  Date/Time:  Friday February 21 2020 19:28:36 EDT Ventricular Rate:  91 PR Interval:    QRS Duration: 89 QT Interval:  359 QTC Calculation: 442 R Axis:   81 Text Interpretation: Sinus rhythm No significant change since last tracing Confirmed by Pryor Curia [id number]) on 02/22/2020 12:39:07 AM         Court Joy was evaluated in Emergency Department on 02/22/2020 for the symptoms described in the history of present illness. He was evaluated in the context of the global COVID-19 pandemic, which necessitated consideration that the patient might be at risk for infection with the SARS-CoV-2 virus that causes COVID-19. Institutional protocols and algorithms that pertain to the evaluation of patients at risk for COVID-19 are in a state of rapid change based on information released by regulatory bodies including  the [employer] and federal and state organizations. These policies and algorithms were followed during the patient's care in the ED.      Fallon Haecker, Layla Maw, DO 02/22/20 [phone]

## 2020-02-22 NOTE — Discharge Instructions (Addendum)
Your labs, chest x-ray, EKG today were reassuring.  I recommend increase fluid intake at home.  You may alternate Tylenol and ibuprofen as needed for pain.

## 2020-02-29 ENCOUNTER — Other Ambulatory Visit: Payer: Self-pay

## 2020-02-29 ENCOUNTER — Encounter (HOSPITAL_COMMUNITY): Payer: Self-pay | Admitting: *Deleted

## 2020-02-29 ENCOUNTER — Emergency Department (HOSPITAL_COMMUNITY)
Admission: EM | Admit: 2020-02-29 | Discharge: 2020-03-01 | Disposition: A | Discharge status: Home / Self Care | Payer: Medicaid Other | Attending: Emergency Medicine | Admitting: Emergency Medicine

## 2020-02-29 DIAGNOSIS — Z20822 Contact with and (suspected) exposure to covid-19: Secondary | ICD-10-CM | POA: Insufficient documentation

## 2020-02-29 DIAGNOSIS — F17210 Nicotine dependence, cigarettes, uncomplicated: Secondary | ICD-10-CM | POA: Insufficient documentation

## 2020-02-29 DIAGNOSIS — F1910 Other psychoactive substance abuse, uncomplicated: Secondary | ICD-10-CM | POA: Diagnosis present

## 2020-02-29 DIAGNOSIS — F19959 Other psychoactive substance use, unspecified with psychoactive substance-induced psychotic disorder, unspecified: Secondary | ICD-10-CM | POA: Diagnosis present

## 2020-02-29 DIAGNOSIS — F32A Depression, unspecified: Secondary | ICD-10-CM

## 2020-02-29 DIAGNOSIS — R440 Auditory hallucinations: Secondary | ICD-10-CM

## 2020-02-29 DIAGNOSIS — J45909 Unspecified asthma, uncomplicated: Secondary | ICD-10-CM | POA: Insufficient documentation

## 2020-02-29 DIAGNOSIS — F332 Major depressive disorder, recurrent severe without psychotic features: Secondary | ICD-10-CM | POA: Insufficient documentation

## 2020-02-29 NOTE — ED Triage Notes (Signed)
When asked why the patient is here he says he "is not really sure". When asked about SI, says that he "feels that way from time to time", denies specific means. Denies HI. Says voices "are telling me really bad things". Reports he "is probably not taking" them like he suppose to.

## 2020-02-29 NOTE — ED Triage Notes (Signed)
Pt belongings removed, changed in to scrubs, wanded by security, ambulatory to treatment room.

## 2020-03-01 DIAGNOSIS — F19959 Other psychoactive substance use, unspecified with psychoactive substance-induced psychotic disorder, unspecified: Secondary | ICD-10-CM | POA: Diagnosis present

## 2020-03-01 DIAGNOSIS — F1910 Other psychoactive substance abuse, uncomplicated: Secondary | ICD-10-CM | POA: Diagnosis present

## 2020-03-01 LAB — CBC
HCT: 42.5 % (ref 39.0–52.0)
Hemoglobin: 14.5 g/dL (ref 13.0–17.0)
MCH: 31.5 pg (ref 26.0–34.0)
MCHC: 34.1 g/dL (ref 30.0–36.0)
MCV: 92.4 fL (ref 80.0–100.0)
Platelets: 334 10*3/uL (ref 150–400)
RBC: 4.6 MIL/uL (ref 4.22–5.81)
RDW: 13 % (ref 11.5–15.5)
WBC: 9.8 10*3/uL (ref 4.0–10.5)
nRBC: 0 % (ref 0.0–0.2)

## 2020-03-01 LAB — ACETAMINOPHEN LEVEL: Acetaminophen (Tylenol), Serum: <10 ug/mL — ABNORMAL LOW (ref 10–30)

## 2020-03-01 LAB — RAPID URINE DRUG SCREEN, HOSP PERFORMED
Amphetamines: NOT DETECTED
Barbiturates: NOT DETECTED
Benzodiazepines: NOT DETECTED
Cocaine: NOT DETECTED
Opiates: NOT DETECTED
Tetrahydrocannabinol: NOT DETECTED

## 2020-03-01 LAB — COMPREHENSIVE METABOLIC PANEL
ALT: 34 U/L (ref 0–44)
AST: 26 U/L (ref 15–41)
Albumin: 4.7 g/dL (ref 3.5–5.0)
Alkaline Phosphatase: 58 U/L (ref 38–126)
Anion gap: 15 (ref 5–15)
BUN: 5 mg/dL — ABNORMAL LOW (ref 6–20)
CO2: 21 mmol/L — ABNORMAL LOW (ref 22–32)
Calcium: 9 mg/dL (ref 8.9–10.3)
Chloride: 102 mmol/L (ref 98–111)
Creatinine, Ser: 0.99 mg/dL (ref 0.61–1.24)
GFR calc Af Amer: >60 mL/min (ref >60)
GFR calc non Af Amer: >60 mL/min (ref >60)
Glucose, Bld: 97 mg/dL (ref 70–99)
Potassium: 3.7 mmol/L (ref 3.5–5.1)
Sodium: 138 mmol/L (ref 135–145)
Total Bilirubin: 0.9 mg/dL (ref 0.3–1.2)
Total Protein: 7.8 g/dL (ref 6.5–8.1)

## 2020-03-01 LAB — ETHANOL: Alcohol, Ethyl (B): <10 mg/dL (ref <10)

## 2020-03-01 LAB — SARS CORONAVIRUS 2 BY RT PCR (HOSPITAL ORDER, PERFORMED IN ~~LOC~~ HOSPITAL LAB): SARS Coronavirus 2: NEGATIVE

## 2020-03-01 LAB — SALICYLATE LEVEL: Salicylate Lvl: <7 mg/dL — ABNORMAL LOW (ref 7.0–30.0)

## 2020-03-01 MED ORDER — NICOTINE 21 MG/24HR TD PT24
21.0000 mg | MEDICATED_PATCH | Freq: Once | TRANSDERMAL | Status: DC
  Administered 2020-03-01: 21 mg via TRANSDERMAL
  Filled 2020-03-01: qty 1

## 2020-03-01 NOTE — BH Assessment (Signed)
Tele Assessment Note   Patient Name: [name] Frey MRN: [id number] Referring Physician: Dr. Charlann Lange, PA-C Location of Patient: [facility] Location of Provider: [place] is an 29 y.o. male presenting with SI with plan. When asked about suicidal plan, patient reported "I really change it up", then stated "a plan I am not sure". When asked onset of SI patient stated "29 years old". During assessment patient was not forthcoming with information. Patient reported being homeless. Patient reported using alcohol, unable to share how amount. Patient does request "mental health". Patient denied prior suicide attempts. Patient admitted to burning himself, no specifics given. Patient reported not receiving any outpatient mental health services at this times. 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 Patient reported having no access to guns or weapons. Patient reported lack of family support. Patient denied HI and psychosis, however EDP reported auditory hallucinations. Patient was calm and cooperative during assessment.   Diagnosis: Major depressive disorder  Past Medical History:  Past Medical History:  Diagnosis Date  . Asthma   . Collar bone fracture   . Migraines     Past Surgical History:  Procedure Laterality Date  . FRACTURE SURGERY      Family History:  Family History  Problem Relation Age of Onset  . Hypertension Mother   . Other Brother        Epilepsy  . Sleep apnea Father     Social History:  reports that he has been smoking cigarettes. He has a 1.50 pack-year smoking history. He has never used smokeless tobacco. He reports current alcohol use. He reports current drug use. Drug: Marijuana.  Additional Social History:  Alcohol / Drug Use Pain Medications: see MAR Prescriptions: see MAR Over the Counter: see MAR  CIWA: CIWA-Ar BP: (!) 161/112 Pulse Rate: 97 COWS:    Allergies: No  Known Allergies  Home Medications: (Not in a hospital admission)   OB/GYN Status:  No LMP for male patient.  General Assessment Data Location of Assessment: [facility] ED TTS Assessment: In system Is this a Tele or Face-to-Face Assessment?: Tele Assessment Is this an Initial Assessment or a Re-assessment for this encounter?: Initial Assessment Patient Accompanied by:: N/A Language Other than English: No Living Arrangements: Homeless/Shelter What gender do you identify as?: Male Marital status: Single Pregnancy Status: No Living Arrangements: Alone Can pt return to current living arrangement?: Yes Admission Status: Voluntary Is patient capable of signing voluntary admission?: Yes Referral Source: Self/Family/Friend     Crisis Care Plan Living Arrangements: Alone Legal Guardian:  (self) Name of Psychiatrist:  (none) Name of Therapist:  (none)  Education Status Is patient currently in school?: No Is the patient employed, unemployed or receiving disability?: Unemployed  Risk to self with the past 6 months Suicidal Ideation: Yes-Currently Present Has patient been a risk to self within the past 6 months prior to admission? : Yes Suicidal Intent: Yes-Currently Present Has patient had any suicidal intent within the past 6 months prior to admission? : Yes Is patient at risk for suicide?: Yes Suicidal Plan?: Yes-Currently Present Has patient had any suicidal plan within the past 6 months prior to admission? : Yes Specify Current Suicidal Plan:  (refused to share) Access to Means: Yes Specify Access to Suicidal Means:  (refused to share) What has been your use of drugs/alcohol within the last 12 months?:  (alcohol) Previous Attempts/Gestures: No How many times?:  (0) Other Self Harm Risks:  (none) Triggers for  Past Attempts: None known Intentional Self Injurious Behavior: Burning Comment - Self Injurious Behavior:  (amt of times unknown) Family Suicide History: Unknown Recent  stressful life event(s):  ("I don't know") Persecutory voices/beliefs?: No Depression: Yes Depression Symptoms: Feeling worthless/self pity, Guilt Substance abuse history and/or treatment for substance abuse?: No Suicide prevention information given to non-admitted patients: Not applicable  Risk to Others within the past 6 months Homicidal Ideation: No Does patient have any lifetime risk of violence toward others beyond the six months prior to admission? : No Thoughts of Harm to Others: No Current Homicidal Intent: No Current Homicidal Plan: No Access to Homicidal Means: No Identified Victim:  (n/a) History of harm to others?: No Assessment of Violence: None Noted Violent Behavior Description:  (none) Does patient have access to weapons?: No Criminal Charges Pending?: No Does patient have a court date: No Is patient on probation?: No  Psychosis Hallucinations: None noted Delusions: None noted  Mental Status Report Appearance/Hygiene: Unremarkable Eye Contact: Fair Motor Activity: Freedom of movement Speech: Logical/coherent Level of Consciousness: Alert Mood: Depressed Affect: Appropriate to circumstance Anxiety Level: Minimal Thought Processes: Coherent, Relevant Judgement: Impaired Orientation: Unable to assess Obsessive Compulsive Thoughts/Behaviors: None  Cognitive Functioning Concentration: Poor Memory: Recent Impaired Is patient IDD: No Insight: Poor Impulse Control: Poor Appetite: Good Have you had any weight changes? : No Change Sleep: No Change Total Hours of Sleep:  ("I don't know") Vegetative Symptoms: Unable to Assess  ADLScreening [facility] Assessment Services) Patient's cognitive ability adequate to safely complete daily activities?: Yes Patient able to express need for assistance with ADLs?: Yes Independently performs ADLs?: Yes (appropriate for developmental age)  Prior Inpatient Therapy Prior Inpatient Therapy: No  Prior Outpatient  Therapy Prior Outpatient Therapy: No Does patient have an ACCT team?: No Does patient have Intensive In-House Services?  : No Does patient have [facility] services? : No Does patient have P4CC services?: No  ADL Screening (condition at time of admission) Patient's cognitive ability adequate to safely complete daily activities?: Yes Patient able to express need for assistance with ADLs?: Yes Independently performs ADLs?: Yes (appropriate for developmental age) Merchant [employer] officer (For Healthcare) Does Patient Have a Medical Advance Directive?: No   Disposition:  Disposition Initial Assessment Completed for this Encounter: Yes  Nira Conn, NP, recommends overnight observation due to safety and stabilization with psych reassessment in the AM.   This service was provided via telemedicine using a 2-way, interactive audio and video technology.  Names of all persons participating in this telemedicine service and their role in this encounter. Name: Nasean Zapf Role: Patient  Name: Al Corpus Role: TTS Clinician  Name:  Role:   Name:  Role:     Burnetta Sabin 03/01/2020 3:52 AM

## 2020-03-01 NOTE — ED Provider Notes (Signed)
[facility]-EMERGENCY DEPT Provider Note   CSN: [id number] Arrival date & time: 02/29/20  2152     History Chief Complaint  Patient presents with   Suicidal    [name] Frey is a 29 y.o. male.  Patient to ED with vague symptoms of depression and auditory hallucinations. He does not give details. He reports SI off and on but none at this time. No HI. He expresses that he needs help for a "mental health" condition.  The history is provided by the patient. No language interpreter was used.       Past Medical History:  Diagnosis Date   Asthma    Collar bone fracture    Migraines     Patient Active Problem List   Diagnosis Date Noted   ASTHMA 12/25/2009   NAUSEA AND VOMITING 04/06/2009   CLOSED FRACTURE OF ANGLE OF JAW 01/12/2009   MIGRAINE, COMMON 09/10/2007    Past Surgical History:  Procedure Laterality Date   FRACTURE SURGERY         Family History  Problem Relation Age of Onset   Hypertension Mother    Other Brother        Epilepsy   Sleep apnea Father     Social History   Tobacco Use   Smoking status: Current Every Day Smoker    Packs/day: 0.50    Years: 3.00    Pack years: 1.50    Types: Cigarettes   Smokeless tobacco: Never Used  Substance Use Topics   Alcohol use: Yes   Drug use: Yes    Types: Marijuana    Comment: opiates    Home Medications Prior to Admission medications   Medication Sig Start Date End Date Taking? Authorizing Provider  acetaminophen (TYLENOL) 500 MG tablet Take 500-1,000 mg by mouth every 6 (six) hours as needed for mild pain or headache.    Yes [provider]  lisinopril (ZESTRIL) 10 MG tablet Take 10 mg by mouth daily.   Yes [provider]  PARoxetine (PAXIL) 20 MG tablet Take 20 mg by mouth daily. 01/13/20  Yes [provider]  dicyclomine (BENTYL) 20 MG tablet Take 1 tablet (20 mg total) by mouth 2 (two) times daily. Patient not taking: Reported on  02/29/2020 02/06/20   Dartha Lodge, PA-C  famotidine (PEPCID) 20 MG tablet Take 1 tablet (20 mg total) by mouth 2 (two) times daily. Patient not taking: Reported on 02/29/2020 02/06/20   Dartha Lodge, PA-C  ondansetron (ZOFRAN ODT) 4 MG disintegrating tablet 4mg  ODT q4 hours prn nausea/vomit Patient not taking: Reported on 02/29/2020 02/06/20   02/08/20, PA-C  predniSONE (DELTASONE) 10 MG tablet 3 po qd for 3 days then 2 po qd for 3 days the 1 po qd for 3 days Patient not taking: Reported on 02/06/2020 08/14/13   08/16/13, DO    Allergies    Patient has no known allergies.  Review of Systems   Review of Systems  Constitutional: Negative for chills and fever.  HENT: Negative.   Respiratory: Negative.   Cardiovascular: Negative.   Gastrointestinal: Negative.   Musculoskeletal: Negative.   Skin: Negative.   Neurological: Negative.   Psychiatric/Behavioral:       See HPI.    Physical Exam Updated Vital Signs BP (!) 161/112 (BP Location: Right Arm)    Pulse 97    Temp 99.1 F (37.3 C)    Resp 16    SpO2 98%  Physical Exam Constitutional:      Appearance: He is well-developed.  HENT:     Head: Normocephalic.  Cardiovascular:     Rate and Rhythm: Normal rate and regular rhythm.  Pulmonary:     Effort: Pulmonary effort is normal.     Breath sounds: Normal breath sounds.  Abdominal:     General: Bowel sounds are normal.     Palpations: Abdomen is soft.     Tenderness: There is no abdominal tenderness. There is no guarding or rebound.  Musculoskeletal:        General: Normal range of motion.     Cervical back: Normal range of motion and neck supple.  Skin:    General: Skin is warm and dry.     Findings: No rash.  Neurological:     Mental Status: He is alert and oriented to person, place, and time.  Psychiatric:        Attention and Perception: He perceives auditory hallucinations.        Mood and Affect: Affect is blunt.        Speech: Speech normal.          Behavior: Behavior is withdrawn. Behavior is cooperative.        Thought Content: Thought content does not include homicidal or suicidal ideation.     ED Results / Procedures / Treatments   Labs (all labs ordered are listed, but only abnormal results are displayed) Labs Reviewed  COMPREHENSIVE METABOLIC PANEL - Abnormal; Notable for the following components:      Result Value   CO2 21 (*)    BUN 5 (*)    All other components within normal limits  SALICYLATE LEVEL - Abnormal; Notable for the following components:   Salicylate Lvl <7.0 (*)    All other components within normal limits  ACETAMINOPHEN LEVEL - Abnormal; Notable for the following components:   Acetaminophen (Tylenol), Serum <10 (*)    All other components within normal limits  ETHANOL  CBC  RAPID URINE DRUG SCREEN, HOSP PERFORMED   Results for orders placed or performed during the hospital encounter of 02/29/20  Comprehensive metabolic panel  Result Value Ref Range   Sodium 138 135 - 145 mmol/L   Potassium 3.7 3.5 - 5.1 mmol/L   Chloride 102 98 - 111 mmol/L   CO2 21 (L) 22 - 32 mmol/L   Glucose, Bld 97 70 - 99 mg/dL   BUN 5 (L) 6 - 20 mg/dL   Creatinine, Ser 1.01 0.61 - 1.24 mg/dL   Calcium 9.0 8.9 - 75.1 mg/dL   Total Protein 7.8 6.5 - 8.1 g/dL   Albumin 4.7 3.5 - 5.0 g/dL   AST 26 15 - 41 U/L   ALT 34 0 - 44 U/L   Alkaline Phosphatase 58 38 - 126 U/L   Total Bilirubin 0.9 0.3 - 1.2 mg/dL   GFR calc non Af Amer >60 >60 mL/min   GFR calc Af Amer >60 >60 mL/min   Anion gap 15 5 - 15  Ethanol  Result Value Ref Range   Alcohol, Ethyl (B) <10 <10 mg/dL  Salicylate level  Result Value Ref Range   Salicylate Lvl <7.0 (L) 7.0 - 30.0 mg/dL  Acetaminophen level  Result Value Ref Range   Acetaminophen (Tylenol), Serum <10 (L) 10 - 30 ug/mL  cbc  Result Value Ref Range   WBC 9.8 4.0 - 10.5 K/uL   RBC 4.60 4.22 - 5.81 MIL/uL   Hemoglobin 14.5 13.0 -  17.0 g/dL   HCT 42.5 39 - 52 %   MCV 92.4 80.0 - 100.0  fL   MCH 31.5 26.0 - 34.0 pg   MCHC 34.1 30.0 - 36.0 g/dL   RDW 13.0 11.5 - 15.5 %   Platelets 334 150 - 400 K/uL   nRBC 0.0 0.0 - 0.2 %     EKG None  Radiology No results found.  Procedures Procedures (including critical care time)  Medications Ordered in ED Medications - No data to display  ED Course  I have reviewed the triage vital signs and the nursing notes.  Pertinent labs & imaging results that were available during my care of the patient were reviewed by me and considered in my medical decision making (see chart for details).    MDM Rules/Calculators/A&P                          Patient to ED requesting help with "mental health". Vague historian, does not seem to want to provide details. States SI "sometimes", not currently. No HI. He endorses auditory hallucinations but does not elaborate.   He is consideration medically cleared for TTS consultation.  Final Clinical Impression(s) / ED Diagnoses Final diagnoses:  None   1. Auditory hallucinations 2. Depression  Rx / DC Orders ED Discharge Orders    None       Dennie Bible 03/01/20 0706    Molpus, Jenny Reichmann, MD 03/01/20 [phone]

## 2020-03-01 NOTE — [facility] Suicide Risk Assessment (Cosign Needed)
Suicide Risk Assessment   [facility] Discharge Suicide Risk Assessment   Principal Problem: Psychoactive substance-induced psychosis (HCC) Discharge Diagnoses: Principal Problem:   Psychoactive substance-induced psychosis (HCC) Active Problems:   Dextromethorphan use disorder, mild, abuse (HCC)   Total Time spent with patient: 45 minutes  Musculoskeletal: Strength & Muscle Tone: within normal limits Gait & Station: normal Patient leans: N/A  Psychiatric Specialty Exam: @ROSBYAGE @  Blood pressure 133/77, pulse (!) 103, temperature 98.1 F (36.7 C), temperature source Oral, resp. rate 18, SpO2 99 %.There is no height or weight on file to calculate BMI.  General Appearance: Casual  Eye Contact::  Good  Speech:  Normal Rate409  Volume:  Normal  Mood:  Anxious  Affect:  Blunt  Thought Process:  Coherent and Descriptions of Associations: Intact  Orientation:  Full (Time, Place, and Person)  Thought Content:  WDL and Logical  Suicidal Thoughts:  No  Homicidal Thoughts:  No  Memory:  Immediate;   Good Recent;   Good Remote;   Good  Judgement:  Fair  Insight:  Fair  Psychomotor Activity:  Normal  Concentration:  Good  Recall:  Good  Fund of Knowledge:Fair  Language: Good  Akathisia:  No  Handed:  Right  AIMS (if indicated):     Assets:  Leisure Time Physical Health Resilience Social Support  Sleep:     Cognition: WNL  ADL's:  Intact   Mental Status Per Nursing Assessment::   On Admission:   High on dextromethamorphone (DMX) with hallucinations.  Patient reported using DMX (dextromethamorphone) yesterday a box of 16 to get high.  Clean for 60 days prior to this incident.  Attending 12-step program and staying at [facility] of [facility].  Stabilized on Paxil and lisinopril.  No psychosis, clear and coherent at this time.  No suicidal/homicidal ideations, hallucinations, or withdrawal symptoms.  Psychiatrically stable for discharge, peer supper at [phone].  Demographic  Factors:  Male and Caucasian  Loss Factors: NA  Historical Factors: NA  Risk Reduction Factors:   Sense of responsibility to family, Living with another person, especially a relative, Positive social support and Positive therapeutic relationship  Continued Clinical Symptoms:  Anxiety, mild  Cognitive Features That Contribute To Risk:  None    Suicide Risk:  Minimal: No identifiable suicidal ideation.  Patients presenting with no risk factors but with morbid ruminations; may be classified as minimal risk based on the severity of the depressive symptoms   Plan Of Care/Follow-up recommendations:  Dextromethamorphone abuse, mild: -Continue with 12-step program -Return to Sober Living Activity:  as tolerated Diet:  heart healthy diet  [phone], NP 03/01/2020, 12:59 PM

## 2020-03-01 NOTE — ED Notes (Signed)
Spoke with Randa Evens, [facility], patient vitals include 99.1 temp and BP 161/112. New vitals, UDS needed and COVID test. Per Marchelle Folks, RN, they will complete new vitals, UDS and COVID screening.

## 2020-03-01 NOTE — ED Notes (Signed)
Sitter at bedside.

## 2020-03-01 NOTE — ED Notes (Signed)
TTS assessment completed.  Nira Conn, NP, recommends overnight observation due to safety and stabilization with psych reassessment in the AM.

## 2020-03-01 NOTE — Consult Note (Addendum)
Patient reported using DMX (dextromethamorphone) yesterday a box of 16 to get high.  Clean for 60 days prior to this incident.  Attending 12-step program and staying at [facility] of [place].  Stabilized on Paxil and lisinopril.  No psychosis, clear and coherent at this time.  No suicidal/homicidal ideations, hallucinations, or withdrawal symptoms.  Psychiatrically stable for discharge, peer supper at [phone].  Nanine Means, PMHNP  Patient seen face-to-face for psychiatric evaluation, chart reviewed and case discussed with the physician extender and developed treatment plan. Reviewed the information documented and agree with the treatment plan. Thedore Mins, MD

## 2020-03-01 NOTE — Discharge Instructions (Signed)
Peer Support at [phone]

## 2020-03-20 ENCOUNTER — Other Ambulatory Visit: Payer: Self-pay

## 2020-03-20 ENCOUNTER — Emergency Department (HOSPITAL_COMMUNITY)
Admission: EM | Admit: 2020-03-20 | Discharge: 2020-03-20 | Disposition: A | Discharge status: Home / Self Care | Payer: Medicaid Other | Attending: Emergency Medicine | Admitting: Emergency Medicine

## 2020-03-20 ENCOUNTER — Encounter (HOSPITAL_COMMUNITY): Payer: Self-pay

## 2020-03-20 DIAGNOSIS — J45909 Unspecified asthma, uncomplicated: Secondary | ICD-10-CM | POA: Insufficient documentation

## 2020-03-20 DIAGNOSIS — R21 Rash and other nonspecific skin eruption: Secondary | ICD-10-CM | POA: Insufficient documentation

## 2020-03-20 DIAGNOSIS — H539 Unspecified visual disturbance: Secondary | ICD-10-CM | POA: Insufficient documentation

## 2020-03-20 DIAGNOSIS — F17210 Nicotine dependence, cigarettes, uncomplicated: Secondary | ICD-10-CM | POA: Insufficient documentation

## 2020-03-20 MED ORDER — HYDROCORTISONE 1 % EX CREA: TOPICAL_CREAM | CUTANEOUS | 0 refills | Status: AC

## 2020-03-20 MED ORDER — FLUORESCEIN SODIUM 1 MG OP STRP
1.0000 | ORAL_STRIP | Freq: Once | OPHTHALMIC | Status: DC
  Filled 2020-03-20: qty 1

## 2020-03-20 MED ORDER — TETRACAINE HCL 0.5 % OP SOLN
2.0000 [drp] | Freq: Once | OPHTHALMIC | Status: DC
  Filled 2020-03-20: qty 4

## 2020-03-20 NOTE — ED Provider Notes (Signed)
[facility]-EMERGENCY DEPT Provider Note   CSN: [id number] Arrival date & time: 03/20/20  1137     History Chief Complaint  Patient presents with  . Rash    [name] Frey is a 29 y.o. male.  HPI  Patient is a 29 year old male with a history of asthma and migraines presenting today with rash as chief complaint.  Patient states that he has a painful not itchy rash to the neck, left ankle, right foot and right arm states he also has an area under his left eye.  He states that he first noticed the symptoms 2 days ago.  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  Patient states he also has noticed some left eye blurriness for the past few days.  He states he has no eye pain, discharge or redness.    Past Medical History:  Diagnosis Date  . Asthma   . Collar bone fracture   . Migraines     Patient Active Problem List   Diagnosis Date Noted  . Psychoactive substance-induced psychosis (HCC) 03/01/2020  . Dextromethorphan use disorder, mild, abuse (HCC) 03/01/2020  . ASTHMA 12/25/2009  . NAUSEA AND VOMITING 04/06/2009  . CLOSED FRACTURE OF ANGLE OF JAW 01/12/2009  . MIGRAINE, COMMON 09/10/2007    Past Surgical History:  Procedure Laterality Date  . FRACTURE SURGERY         Family History  Problem Relation Age of Onset  . Hypertension Mother   . Other Brother        Epilepsy  . Sleep apnea Father     Social History   Tobacco Use  . Smoking status: Current Every Day Smoker    Packs/day: 0.50    Years: 3.00    Pack years: 1.50    Types: Cigarettes  . Smokeless tobacco: Never Used  Vaping Use  . Vaping Use: Never used  Substance Use Topics  . Alcohol use: Yes  . Drug use: Yes    Types: Marijuana    Comment: not currently using heroin    Home Medications Prior to Admission medications   Medication Sig Start Date End Date Taking? Authorizing Provider    hydrocortisone cream 1 % Apply to affected area 2 times daily DO NOT APPLY TO FACE 03/20/20   Eulanda Dorion, Stevphen Meuse S, PA  lisinopril (ZESTRIL) 10 MG tablet Take 10 mg by mouth daily.    [provider]  PARoxetine (PAXIL) 20 MG tablet Take 20 mg by mouth daily. 01/13/20   [provider]    Allergies    Patient has no known allergies.  Review of Systems   Review of Systems  Constitutional: Negative for fever.  HENT: Negative for congestion.   Eyes: Positive for visual disturbance. Negative for pain and redness.  Respiratory: Negative for shortness of breath.   Cardiovascular: Negative for chest pain.  Gastrointestinal: Negative for abdominal distention.  Skin: Positive for rash.  Neurological: Negative for dizziness and headaches.    Physical Exam Updated Vital Signs BP (!) 142/85 (BP Location: Right Arm)   Pulse 95   Temp 98.1 F (36.7 C) (Oral)   Resp 16   Ht 5\' 11"  (1.803 m)   Wt 90.7 kg   SpO2 100%   BMI 27.89 kg/m   Physical Exam Vitals and nursing note reviewed.  Constitutional:      General: He is not in acute distress.  Appearance: Normal appearance. He is not ill-appearing.  HENT:     Head: Normocephalic and atraumatic.     Mouth/Throat:     Mouth: Mucous membranes are moist.  Eyes:     General: No scleral icterus.       Right eye: No discharge.        Left eye: No discharge.     Extraocular Movements: Extraocular movements intact.     Conjunctiva/sclera: Conjunctivae normal.     Pupils: Pupils are equal, round, and reactive to light.     Comments: Fluorescein eye exam with no evidence of abrasion. Sclera is normal.  Pupils are normal.  See technician note for visual acuity. Mild vision differences.   Pulmonary:     Effort: Pulmonary effort is normal.     Breath sounds: No stridor.  Skin:    General: Skin is warm and dry.     Capillary Refill: Capillary refill takes less than 2 seconds.     Findings: Rash present.     Comments: Patient  has erythematous, nonpruritic, nonblanching maculopapular rash to his right neck, left inferior eyelid, right foot and left ankle. No vesicles.  Neurological:     Mental Status: He is alert and oriented to person, place, and time. Mental status is at baseline.           ED Results / Procedures / Treatments   Labs (all labs ordered are listed, but only abnormal results are displayed) Labs Reviewed - No data to display  EKG None  Radiology No results found.  Procedures Procedures (including critical care time)  Medications Ordered in ED Medications  tetracaine (PONTOCAINE) 0.5 % ophthalmic solution 2 drop (has no administration in time range)  fluorescein ophthalmic strip 1 strip (has no administration in time range)    ED Course  I have reviewed the triage vital signs and the nursing notes.  Pertinent labs & imaging results that were available during my care of the patient were reviewed by me and considered in my medical decision making (see chart for details).    MDM Rules/Calculators/A&P                          Well-appearing 29 year old male presented today with 2 days of nondescript rash to his right neck, left inferior eyelid, right foot and left ankle.  Physical exam is notable for rash to the above areas.  Fluorescein exam was normal.  Patient has diffuse rash making shingles very unlikely he has no herpetic fluorescein uptake on exam.  No evidence of corneal abrasion.  He is not a contact lens wear.  He does wear glasses however.  No evidence of conjunctivitis.  Likely contact dermatitis.  Questionable blurry vision left eye possibly blepharitis versus over functioning meibomian gland.  Will recommend warm compresses and follow-up with ophthalmology if symptoms continue.  Patient given hydrocortisone cream for nonface rash use.  He will follow-up with his primary care doctor.  I discussed this case with my attending physician who cosigned this note including  patient's presenting symptoms, physical exam, and planned diagnostics and interventions. Attending physician stated agreement with plan or made changes to plan which were implemented.   Patient understanding of plan.  And follow-up.  Final Clinical Impression(s) / ED Diagnoses Final diagnoses:  Rash    Rx / DC Orders ED Discharge Orders         Ordered    hydrocortisone cream 1 %  Discontinue  Reprint     03/20/20 1257           Gailen Shelter, [place] 03/20/20 1300    Pollyann Savoy, MD 03/20/20 1455

## 2020-03-20 NOTE — Discharge Instructions (Addendum)
I have provided you with a steroid cream to use you may apply twice daily.  Do not apply to your face.  You may apply to elsewhere in your body.  I do recommend warm compresses to the eyelids and monitor your symptoms.  If your blurry vision does not improve by Tuesday please call the ophthalmologist whose number of provided you.  You may also take Benadryl 25 mg every 6 hours as needed for symptoms.

## 2020-03-20 NOTE — ED Triage Notes (Signed)
Patient c/o a painful rash to the neck, left ankle, right arm, and under the left eye x 2 days.

## 2020-06-19 IMAGING — CR DG CHEST 2V
2 series · 2 of 2 positions shown · non-contrast
Comparison: 02/06/2020

CLINICAL DATA: Chest pain short of breath

EXAM:
CHEST - 2 VIEW

[w chest pa]
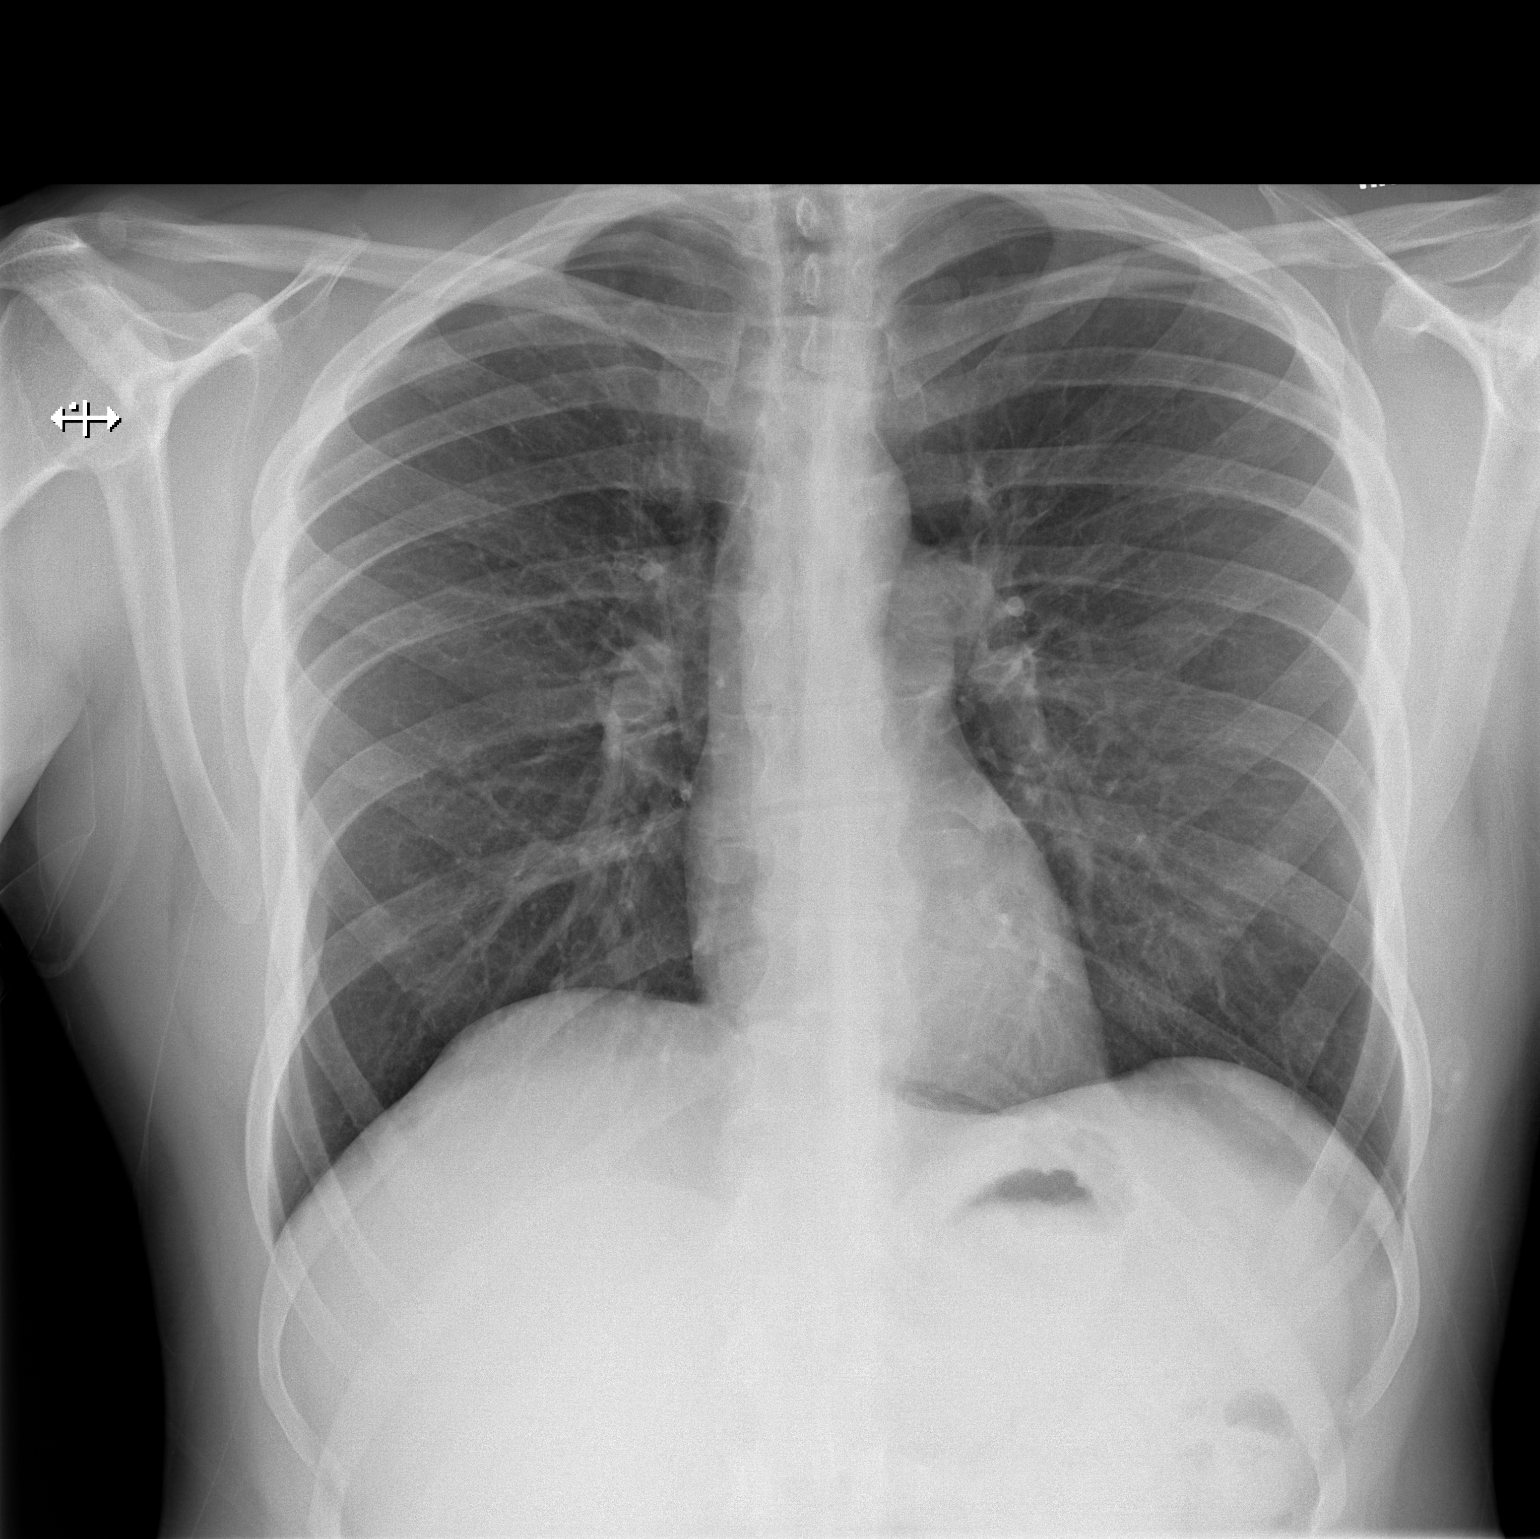

[w chest lat]
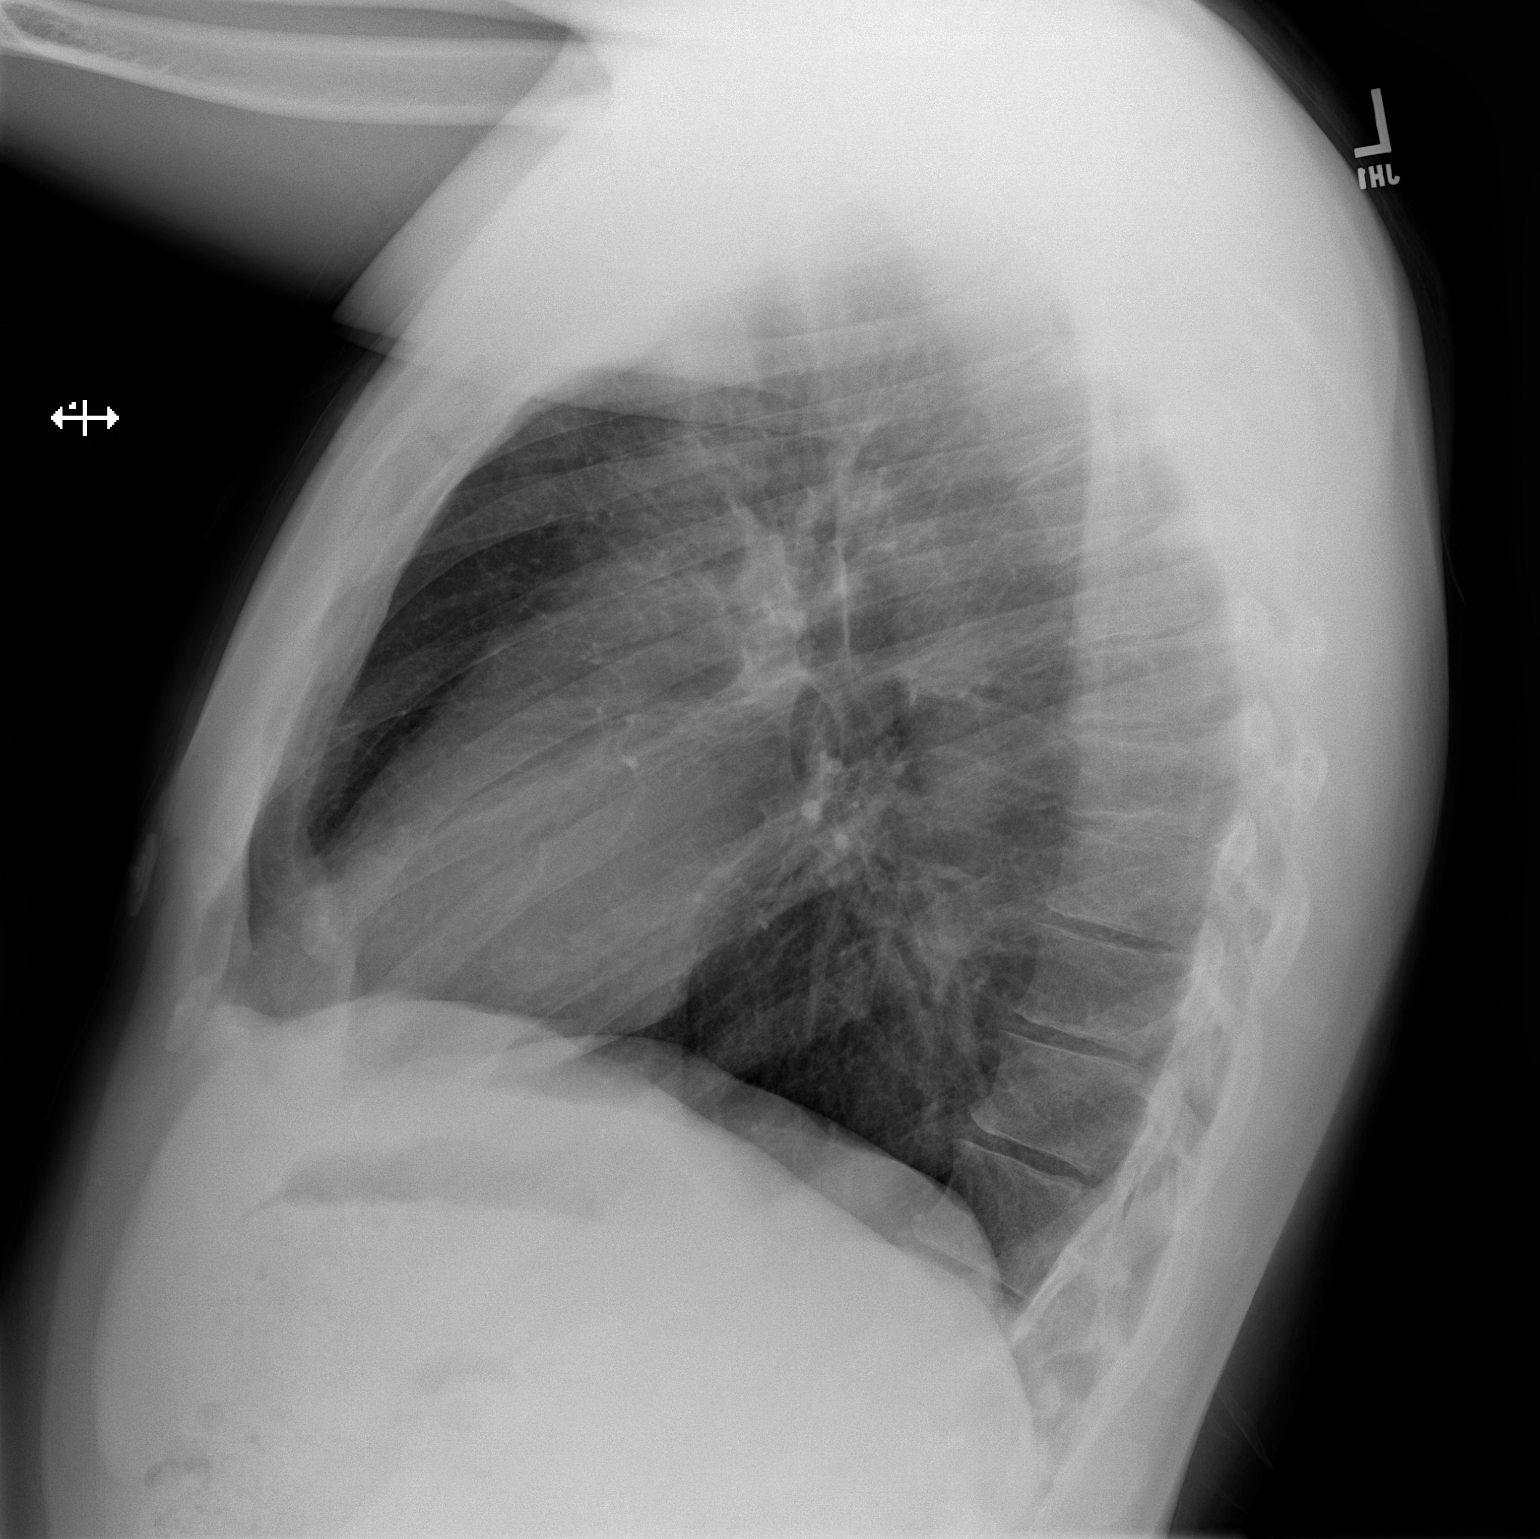

[2 of 2 positions shown; findings below may reference images not displayed]

FINDINGS: The heart size and mediastinal contours are within normal limits.
Both lungs are clear. The visualized skeletal structures are
unremarkable.
IMPRESSION: No active cardiopulmonary disease.

## 2023-05-18 ENCOUNTER — Other Ambulatory Visit: Payer: Self-pay

## 2023-05-18 ENCOUNTER — Encounter (HOSPITAL_BASED_OUTPATIENT_CLINIC_OR_DEPARTMENT_OTHER): Payer: Self-pay | Admitting: Emergency Medicine

## 2023-05-18 ENCOUNTER — Emergency Department (HOSPITAL_BASED_OUTPATIENT_CLINIC_OR_DEPARTMENT_OTHER)
Admission: EM | Admit: 2023-05-18 | Discharge: 2023-05-18 | Disposition: A | Discharge status: Home / Self Care | Payer: No Typology Code available for payment source | Attending: Emergency Medicine | Admitting: Emergency Medicine

## 2023-05-18 ENCOUNTER — Emergency Department (HOSPITAL_BASED_OUTPATIENT_CLINIC_OR_DEPARTMENT_OTHER): Payer: No Typology Code available for payment source

## 2023-05-18 DIAGNOSIS — D72829 Elevated white blood cell count, unspecified: Secondary | ICD-10-CM | POA: Insufficient documentation

## 2023-05-18 DIAGNOSIS — J45909 Unspecified asthma, uncomplicated: Secondary | ICD-10-CM | POA: Diagnosis not present

## 2023-05-18 DIAGNOSIS — R1084 Generalized abdominal pain: Secondary | ICD-10-CM | POA: Diagnosis not present

## 2023-05-18 DIAGNOSIS — R7401 Elevation of levels of liver transaminase levels: Secondary | ICD-10-CM | POA: Insufficient documentation

## 2023-05-18 DIAGNOSIS — R112 Nausea with vomiting, unspecified: Secondary | ICD-10-CM | POA: Insufficient documentation

## 2023-05-18 DIAGNOSIS — R7989 Other specified abnormal findings of blood chemistry: Secondary | ICD-10-CM

## 2023-05-18 LAB — CBC WITH DIFFERENTIAL/PLATELET
Abs Immature Granulocytes: 0.04 10*3/uL (ref 0.00–0.07)
Basophils Absolute: 0.1 10*3/uL (ref 0.0–0.1)
Basophils Relative: 0 %
Eosinophils Absolute: 0.3 10*3/uL (ref 0.0–0.5)
Eosinophils Relative: 2 %
HCT: 43 % (ref 39.0–52.0)
Hemoglobin: 15.2 g/dL (ref 13.0–17.0)
Immature Granulocytes: 0 %
Lymphocytes Relative: 18 %
Lymphs Abs: 2.5 10*3/uL (ref 0.7–4.0)
MCH: 31.4 pg (ref 26.0–34.0)
MCHC: 35.3 g/dL (ref 30.0–36.0)
MCV: 88.8 fL (ref 80.0–100.0)
Monocytes Absolute: 1.2 10*3/uL — ABNORMAL HIGH (ref 0.1–1.0)
Monocytes Relative: 8 %
Neutro Abs: 10.1 10*3/uL — ABNORMAL HIGH (ref 1.7–7.7)
Neutrophils Relative %: 72 %
Platelets: 356 10*3/uL (ref 150–400)
RBC: 4.84 MIL/uL (ref 4.22–5.81)
RDW: 12.5 % (ref 11.5–15.5)
WBC: 14.1 10*3/uL — ABNORMAL HIGH (ref 4.0–10.5)
nRBC: 0 % (ref 0.0–0.2)

## 2023-05-18 LAB — COMPREHENSIVE METABOLIC PANEL
ALT: 195 U/L — ABNORMAL HIGH (ref 0–44)
AST: 90 U/L — ABNORMAL HIGH (ref 15–41)
Albumin: 4.1 g/dL (ref 3.5–5.0)
Alkaline Phosphatase: 72 U/L (ref 38–126)
Anion gap: 11 (ref 5–15)
BUN: 14 mg/dL (ref 6–20)
CO2: 19 mmol/L — ABNORMAL LOW (ref 22–32)
Calcium: 9.4 mg/dL (ref 8.9–10.3)
Chloride: 107 mmol/L (ref 98–111)
Creatinine, Ser: 0.9 mg/dL (ref 0.61–1.24)
GFR, Estimated: >60 mL/min (ref >60)
Glucose, Bld: 150 mg/dL — ABNORMAL HIGH (ref 70–99)
Potassium: 3.8 mmol/L (ref 3.5–5.1)
Sodium: 137 mmol/L (ref 135–145)
Total Bilirubin: 0.8 mg/dL (ref 0.3–1.2)
Total Protein: 7.2 g/dL (ref 6.5–8.1)

## 2023-05-18 LAB — LIPASE, BLOOD: Lipase: 29 U/L (ref 11–51)

## 2023-05-18 MED ORDER — SODIUM CHLORIDE 0.9 % IV SOLN: INTRAVENOUS | Status: DC | PRN

## 2023-05-18 MED ORDER — ONDANSETRON 4 MG PO TBDP: 4.0000 mg | ORAL_TABLET | Freq: Three times a day (TID) | ORAL | 0 refills | Status: AC | PRN

## 2023-05-18 MED ORDER — KETOROLAC TROMETHAMINE 30 MG/ML IJ SOLN
30.0000 mg | Freq: Once | INTRAMUSCULAR | Status: AC
  Administered 2023-05-18: 30 mg via INTRAVENOUS
  Filled 2023-05-18: qty 1

## 2023-05-18 MED ORDER — SODIUM CHLORIDE 0.9 % IV BOLUS
1000.0000 mL | Freq: Once | INTRAVENOUS | Status: AC
  Administered 2023-05-18: 1000 mL via INTRAVENOUS

## 2023-05-18 MED ORDER — FAMOTIDINE IN NACL 20-0.9 MG/50ML-% IV SOLN
20.0000 mg | Freq: Once | INTRAVENOUS | Status: AC
  Administered 2023-05-18: 20 mg via INTRAVENOUS
  Filled 2023-05-18: qty 50

## 2023-05-18 MED ORDER — PROMETHAZINE HCL 25 MG/ML IJ SOLN
INTRAMUSCULAR | Status: AC
  Filled 2023-05-18: qty 1

## 2023-05-18 MED ORDER — SODIUM CHLORIDE 0.9 % IV SOLN
25.0000 mg | Freq: Once | INTRAVENOUS | Status: AC
  Administered 2023-05-18: 25 mg via INTRAVENOUS
  Filled 2023-05-18: qty 1

## 2023-05-18 MED ORDER — ONDANSETRON HCL 4 MG/2ML IJ SOLN
4.0000 mg | Freq: Once | INTRAMUSCULAR | Status: AC
  Administered 2023-05-18: 4 mg via INTRAVENOUS
  Filled 2023-05-18: qty 2

## 2023-05-18 NOTE — Discharge Instructions (Signed)
You were seen in the emergency department for nausea vomiting.  Your ultrasound did not show any signs of gallstones.  Your liver numbers were mildly elevated.  We are prescribing you some nausea medication.  Please start with a clear liquid diet advance as tolerated.  Follow-up with your regular doctor.  Return to the emergency department if any worsening or concerning symptoms

## 2023-05-18 NOTE — ED Provider Notes (Signed)
Dr. Judd Lien.  32 year old male here with acute nausea and vomiting.  Has been improved after medication.  His AST and ALT and white count are elevated so he is pending right upper quadrant ultrasound.  Disposition per results of testing. Physical Exam  BP (!) 157/91 (BP Location: Right Arm)   Pulse 87   Temp (!) 97.5 F (36.4 C) (Oral)   Resp 19   Ht 5\' 11"  (1.803 m)   Wt 86.2 kg   SpO2 100%   BMI 26.50 kg/m   Physical Exam  Procedures  Procedures  ED Course / MDM    Medical Decision Making Amount and/or Complexity of Data Reviewed Labs: ordered. Radiology: ordered.  Risk Prescription drug management.   Patient's right upper quadrant ultrasound does not show any acute findings.  Patient required a second round of nausea medication but is tolerating p.o. now.  Return instructions discussed       Terrilee Files, MD 05/18/23 1731

## 2023-05-18 NOTE — ED Notes (Addendum)
EDP made aware.  

## 2023-05-18 NOTE — ED Triage Notes (Signed)
Per pt family pt has emesis for 3-4 hours. Denies any new foods or meds. States unable to hold down water.

## 2023-05-18 NOTE — ED Provider Notes (Signed)
[facility] Provider Note   CSN: [id number] Arrival date & time: 05/18/23  0510     History  Chief Complaint  Patient presents with   Emesis    [name] Frey is a 32 y.o. male.  Patient is a 32 year old male with history of asthma, heroin abuse.  Patient presenting today with complaints of nausea and vomiting.  He reports he has been vomiting constantly for the past 3 hours.  He denies any diarrhea.  He denies fevers or chills.  He does admit to generalized abdominal cramping.  No ill contacts.  He denies having eaten any undercooked or suspicious foods.  The history is provided by the patient.       Home Medications Prior to Admission medications   Medication Sig Start Date End Date Taking? Authorizing Provider  hydrocortisone cream 1 % Apply to affected area 2 times daily DO NOT APPLY TO FACE 03/20/20   Fondaw, Stevphen Meuse S, PA  lisinopril (ZESTRIL) 10 MG tablet Take 10 mg by mouth daily.    [provider]  PARoxetine (PAXIL) 20 MG tablet Take 20 mg by mouth daily. 01/13/20   [provider]      Allergies    Patient has no known allergies.    Review of Systems   Review of Systems  All other systems reviewed and are negative.   Physical Exam Updated Vital Signs BP (!) 135/99 (BP Location: Right Arm)   Pulse 98   Temp (!) 96 F (35.6 C) (Tympanic)   Resp (!) 26   Ht 5\' 11"  (1.803 m)   Wt 86.2 kg   SpO2 100%   BMI 26.50 kg/m  Physical Exam Vitals and nursing note reviewed.  Constitutional:      General: He is not in acute distress.    Appearance: He is well-developed. He is not diaphoretic.  HENT:     Head: Normocephalic and atraumatic.  Cardiovascular:     Rate and Rhythm: Normal rate and regular rhythm.     Heart sounds: No murmur heard.    No friction rub.  Pulmonary:     Effort: Pulmonary effort is normal. No respiratory distress.     Breath sounds: Normal breath sounds. No wheezing or  rales.  Abdominal:     General: Bowel sounds are normal. There is no distension.     Palpations: Abdomen is soft.     Tenderness: There is no abdominal tenderness.  Musculoskeletal:        General: Normal range of motion.     Cervical back: Normal range of motion and neck supple.  Skin:    General: Skin is warm and dry.  Neurological:     Mental Status: He is alert and oriented to person, place, and time.     Coordination: Coordination normal.     ED Results / Procedures / Treatments   Labs (all labs ordered are listed, but only abnormal results are displayed) Labs Reviewed - No data to display  EKG None  Radiology No results found.  Procedures Procedures    Medications Ordered in ED Medications  sodium chloride 0.9 % bolus 1,000 mL (has no administration in time range)  ondansetron (ZOFRAN) injection 4 mg (has no administration in time range)  ketorolac (TORADOL) 30 MG/ML injection 30 mg (has no administration in time range)    ED Course/ Medical Decision Making/ A&P  Patient presenting with complaints of nausea and vomiting and generalized abdominal pain.  This started acutely approximately 3 hours prior to arrival.  He reports constant vomiting since this episode started.  He arrives here actively vomiting, but with stable vital signs and is afebrile.  Workup initiated including CBC, CMP, and lipase.  He has mild elevations of his transaminases and white count of 14,000, but laboratory studies otherwise unremarkable.  Patient has received IV fluids along with Zofran and Toradol and now seems to be feeling better.  Due to his elevation of LFTs and leukocytosis, I feel as though patient should have an ultrasound to rule out cholecystitis.  This study has been ordered and care will be signed out to oncoming provider at shift change with disposition pending.  Final Clinical Impression(s) / ED Diagnoses Final diagnoses:  None    Rx / DC Orders ED Discharge Orders      None         Geoffery Lyons, MD 05/18/23 [phone]

## 2023-05-18 NOTE — ED Notes (Signed)
Pt attempting PO challenge

## 2023-09-18 ENCOUNTER — Ambulatory Visit (HOSPITAL_BASED_OUTPATIENT_CLINIC_OR_DEPARTMENT_OTHER)
Admission: EM | Admit: 2023-09-18 | Discharge: 2023-09-18 | Disposition: A | Discharge status: Home / Self Care | Payer: No Typology Code available for payment source | Attending: Emergency Medicine | Admitting: Emergency Medicine

## 2023-09-18 ENCOUNTER — Emergency Department (HOSPITAL_BASED_OUTPATIENT_CLINIC_OR_DEPARTMENT_OTHER): Payer: No Typology Code available for payment source | Admitting: Anesthesiology

## 2023-09-18 ENCOUNTER — Emergency Department (HOSPITAL_COMMUNITY): Payer: No Typology Code available for payment source | Admitting: Anesthesiology

## 2023-09-18 ENCOUNTER — Encounter (HOSPITAL_BASED_OUTPATIENT_CLINIC_OR_DEPARTMENT_OTHER): Payer: Self-pay | Admitting: Radiology

## 2023-09-18 ENCOUNTER — Other Ambulatory Visit: Payer: Self-pay

## 2023-09-18 ENCOUNTER — Emergency Department (HOSPITAL_BASED_OUTPATIENT_CLINIC_OR_DEPARTMENT_OTHER): Payer: No Typology Code available for payment source

## 2023-09-18 ENCOUNTER — Encounter (HOSPITAL_COMMUNITY)
Admission: EM | Disposition: A | Discharge status: Home / Self Care | Payer: Self-pay | Source: Home / Self Care | Attending: Emergency Medicine

## 2023-09-18 DIAGNOSIS — F17200 Nicotine dependence, unspecified, uncomplicated: Secondary | ICD-10-CM | POA: Insufficient documentation

## 2023-09-18 DIAGNOSIS — K353 Acute appendicitis with localized peritonitis, without perforation or gangrene: Secondary | ICD-10-CM | POA: Insufficient documentation

## 2023-09-18 DIAGNOSIS — J45909 Unspecified asthma, uncomplicated: Secondary | ICD-10-CM | POA: Diagnosis not present

## 2023-09-18 DIAGNOSIS — K3580 Unspecified acute appendicitis: Secondary | ICD-10-CM

## 2023-09-18 HISTORY — PX: LAPAROSCOPIC APPENDECTOMY: SHX408

## 2023-09-18 LAB — COMPREHENSIVE METABOLIC PANEL
ALT: 182 U/L — ABNORMAL HIGH (ref 0–44)
AST: 86 U/L — ABNORMAL HIGH (ref 15–41)
Albumin: 4.5 g/dL (ref 3.5–5.0)
Alkaline Phosphatase: 57 U/L (ref 38–126)
Anion gap: 10 (ref 5–15)
BUN: 13 mg/dL (ref 6–20)
CO2: 23 mmol/L (ref 22–32)
Calcium: 9.5 mg/dL (ref 8.9–10.3)
Chloride: 102 mmol/L (ref 98–111)
Creatinine, Ser: 0.91 mg/dL (ref 0.61–1.24)
GFR, Estimated: >60 mL/min (ref >60)
Glucose, Bld: 176 mg/dL — ABNORMAL HIGH (ref 70–99)
Potassium: 4.2 mmol/L (ref 3.5–5.1)
Sodium: 135 mmol/L (ref 135–145)
Total Bilirubin: 0.7 mg/dL (ref <1.2)
Total Protein: 7.8 g/dL (ref 6.5–8.1)

## 2023-09-18 LAB — CBC
HCT: 44.1 % (ref 39.0–52.0)
Hemoglobin: 15.2 g/dL (ref 13.0–17.0)
MCH: 31 pg (ref 26.0–34.0)
MCHC: 34.5 g/dL (ref 30.0–36.0)
MCV: 89.8 fL (ref 80.0–100.0)
Platelets: 335 10*3/uL (ref 150–400)
RBC: 4.91 MIL/uL (ref 4.22–5.81)
RDW: 12.4 % (ref 11.5–15.5)
WBC: 18.4 10*3/uL — ABNORMAL HIGH (ref 4.0–10.5)
nRBC: 0 % (ref 0.0–0.2)

## 2023-09-18 LAB — URINALYSIS, MICROSCOPIC (REFLEX)

## 2023-09-18 LAB — URINALYSIS, ROUTINE W REFLEX MICROSCOPIC
Bilirubin Urine: NEGATIVE
Glucose, UA: NEGATIVE mg/dL
Hgb urine dipstick: NEGATIVE
Ketones, ur: 15 mg/dL — AB
Leukocytes,Ua: NEGATIVE
Nitrite: NEGATIVE
Protein, ur: 30 mg/dL — AB
Specific Gravity, Urine: >=1.03 (ref 1.005–1.030)
pH: 6.5 (ref 5.0–8.0)

## 2023-09-18 LAB — LIPASE, BLOOD: Lipase: 32 U/L (ref 11–51)

## 2023-09-18 SURGERY — APPENDECTOMY, LAPAROSCOPIC
Anesthesia: General

## 2023-09-18 MED ORDER — DICYCLOMINE HCL 10 MG/ML IM SOLN
20.0000 mg | Freq: Once | INTRAMUSCULAR | Status: AC
  Administered 2023-09-18: 20 mg via INTRAMUSCULAR
  Filled 2023-09-18: qty 2

## 2023-09-18 MED ORDER — ALUM & MAG HYDROXIDE-SIMETH 200-200-20 MG/5ML PO SUSP
30.0000 mL | Freq: Once | ORAL | Status: AC
  Administered 2023-09-18: 30 mL via TOPICAL
  Filled 2023-09-18: qty 30

## 2023-09-18 MED ORDER — SODIUM CHLORIDE 0.9 % IV SOLN
1000.0000 mL | INTRAVENOUS | Status: DC
  Administered 2023-09-18: 1000 mL via INTRAVENOUS

## 2023-09-18 MED ORDER — PHENYLEPHRINE 80 MCG/ML (10ML) SYRINGE FOR IV PUSH (FOR BLOOD PRESSURE SUPPORT)
PREFILLED_SYRINGE | INTRAVENOUS | Status: AC
  Filled 2023-09-18: qty 10

## 2023-09-18 MED ORDER — MIDAZOLAM HCL 2 MG/2ML IJ SOLN
INTRAMUSCULAR | Status: AC
  Filled 2023-09-18: qty 2

## 2023-09-18 MED ORDER — MORPHINE SULFATE (PF) 2 MG/ML IV SOLN
2.0000 mg | Freq: Once | INTRAVENOUS | Status: AC
  Administered 2023-09-18: 2 mg via INTRAVENOUS
  Filled 2023-09-18: qty 1

## 2023-09-18 MED ORDER — ONDANSETRON HCL 4 MG/2ML IJ SOLN: 4.0000 mg | Freq: Once | INTRAMUSCULAR | Status: DC | PRN

## 2023-09-18 MED ORDER — OXYCODONE HCL 5 MG PO TABS: 5.0000 mg | ORAL_TABLET | Freq: Once | ORAL | Status: DC | PRN

## 2023-09-18 MED ORDER — HYDROMORPHONE HCL 1 MG/ML IJ SOLN
INTRAMUSCULAR | Status: DC | PRN
  Administered 2023-09-18 (×2): 1 mg via INTRAVENOUS

## 2023-09-18 MED ORDER — BUPIVACAINE-EPINEPHRINE (PF) 0.25% -1:200000 IJ SOLN
INTRAMUSCULAR | Status: DC | PRN
  Administered 2023-09-18: 10 mL

## 2023-09-18 MED ORDER — FENTANYL CITRATE (PF) 100 MCG/2ML IJ SOLN
INTRAMUSCULAR | Status: DC | PRN
  Administered 2023-09-18: 50 ug via INTRAVENOUS
  Administered 2023-09-18: 100 ug via INTRAVENOUS
  Administered 2023-09-18 (×2): 50 ug via INTRAVENOUS

## 2023-09-18 MED ORDER — KETOROLAC TROMETHAMINE 30 MG/ML IJ SOLN: INTRAMUSCULAR | Status: DC | PRN

## 2023-09-18 MED ORDER — LACTATED RINGERS IV SOLN: INTRAVENOUS | Status: DC | PRN

## 2023-09-18 MED ORDER — KETOROLAC TROMETHAMINE 15 MG/ML IJ SOLN
15.0000 mg | Freq: Once | INTRAMUSCULAR | Status: AC
  Administered 2023-09-18: 15 mg via INTRAVENOUS
  Filled 2023-09-18: qty 1

## 2023-09-18 MED ORDER — ACETAMINOPHEN 500 MG PO TABS: 1000.0000 mg | ORAL_TABLET | Freq: Four times a day (QID) | ORAL | Status: AC | PRN

## 2023-09-18 MED ORDER — KETOROLAC TROMETHAMINE 30 MG/ML IJ SOLN
INTRAMUSCULAR | Status: AC
  Filled 2023-09-18: qty 1

## 2023-09-18 MED ORDER — ONDANSETRON HCL 4 MG/2ML IJ SOLN
4.0000 mg | Freq: Once | INTRAMUSCULAR | Status: AC | PRN
  Administered 2023-09-18: 4 mg via INTRAVENOUS
  Filled 2023-09-18: qty 2

## 2023-09-18 MED ORDER — SUGAMMADEX SODIUM 200 MG/2ML IV SOLN
INTRAVENOUS | Status: DC | PRN
  Administered 2023-09-18: 50 mg via INTRAVENOUS
  Administered 2023-09-18: 150 mg via INTRAVENOUS

## 2023-09-18 MED ORDER — SODIUM CHLORIDE 0.9 % IV SOLN: 1000.0000 mL | INTRAVENOUS | Status: DC

## 2023-09-18 MED ORDER — OXYCODONE HCL 5 MG/5ML PO SOLN: 5.0000 mg | Freq: Once | ORAL | Status: DC | PRN

## 2023-09-18 MED ORDER — ALUM & MAG HYDROXIDE-SIMETH 200-200-20 MG/5 ML NICU TOPICAL: 1.0000 | TOPICAL | Status: DC

## 2023-09-18 MED ORDER — SODIUM CHLORIDE 0.9 % IR SOLN
Status: DC | PRN
  Administered 2023-09-18: 1000 mL

## 2023-09-18 MED ORDER — IBUPROFEN 200 MG PO TABS: 600.0000 mg | ORAL_TABLET | Freq: Three times a day (TID) | ORAL | Status: AC | PRN

## 2023-09-18 MED ORDER — KETOROLAC TROMETHAMINE 30 MG/ML IJ SOLN: 30.0000 mg | Freq: Once | INTRAMUSCULAR | Status: DC | PRN

## 2023-09-18 MED ORDER — PHENYLEPHRINE HCL (PRESSORS) 10 MG/ML IV SOLN
INTRAVENOUS | Status: DC | PRN
  Administered 2023-09-18: 80 ug via INTRAVENOUS

## 2023-09-18 MED ORDER — BUPIVACAINE-EPINEPHRINE 0.25% -1:200000 IJ SOLN
INTRAMUSCULAR | Status: AC
  Filled 2023-09-18: qty 1

## 2023-09-18 MED ORDER — ROCURONIUM BROMIDE 100 MG/10ML IV SOLN
INTRAVENOUS | Status: DC | PRN
  Administered 2023-09-18: 60 mg via INTRAVENOUS

## 2023-09-18 MED ORDER — SODIUM CHLORIDE 0.9 % IV BOLUS
1000.0000 mL | Freq: Once | INTRAVENOUS | Status: AC
  Administered 2023-09-18: 1000 mL via INTRAVENOUS

## 2023-09-18 MED ORDER — ONDANSETRON HCL 4 MG/2ML IJ SOLN
INTRAMUSCULAR | Status: DC | PRN
  Administered 2023-09-18: 4 mg via INTRAVENOUS

## 2023-09-18 MED ORDER — KETOROLAC TROMETHAMINE 30 MG/ML IJ SOLN
INTRAMUSCULAR | Status: DC | PRN
  Administered 2023-09-18: 15 mg via INTRAVENOUS

## 2023-09-18 MED ORDER — DEXAMETHASONE SODIUM PHOSPHATE 4 MG/ML IJ SOLN
INTRAMUSCULAR | Status: DC | PRN
  Administered 2023-09-18: 8 mg via INTRAVENOUS

## 2023-09-18 MED ORDER — CAPSAICIN 0.025 % EX CREA
TOPICAL_CREAM | CUTANEOUS | Status: AC
  Filled 2023-09-18: qty 60

## 2023-09-18 MED ORDER — IOHEXOL 300 MG/ML  SOLN
100.0000 mL | Freq: Once | INTRAMUSCULAR | Status: AC | PRN
  Administered 2023-09-18: 100 mL via INTRAVENOUS

## 2023-09-18 MED ORDER — DEXMEDETOMIDINE HCL IN NACL 80 MCG/20ML IV SOLN
INTRAVENOUS | Status: AC
  Filled 2023-09-18: qty 20

## 2023-09-18 MED ORDER — METRONIDAZOLE 500 MG/100ML IV SOLN
500.0000 mg | Freq: Once | INTRAVENOUS | Status: AC
  Administered 2023-09-18: 500 mg via INTRAVENOUS
  Filled 2023-09-18: qty 100

## 2023-09-18 MED ORDER — FENTANYL CITRATE (PF) 250 MCG/5ML IJ SOLN
INTRAMUSCULAR | Status: AC
  Filled 2023-09-18: qty 5

## 2023-09-18 MED ORDER — DEXMEDETOMIDINE HCL IN NACL 80 MCG/20ML IV SOLN
INTRAVENOUS | Status: DC | PRN
  Administered 2023-09-18 (×3): 4 ug via INTRAVENOUS

## 2023-09-18 MED ORDER — HYDROMORPHONE HCL 2 MG/ML IJ SOLN
INTRAMUSCULAR | Status: AC
  Filled 2023-09-18: qty 1

## 2023-09-18 MED ORDER — SODIUM CHLORIDE 0.9 % IV SOLN
2.0000 g | Freq: Once | INTRAVENOUS | Status: AC
  Administered 2023-09-18: 2 g via INTRAVENOUS
  Filled 2023-09-18: qty 20

## 2023-09-18 MED ORDER — SODIUM CHLORIDE 0.9 % IV BOLUS (SEPSIS)
1000.0000 mL | Freq: Once | INTRAVENOUS | Status: AC
  Administered 2023-09-18: 1000 mL via INTRAVENOUS

## 2023-09-18 MED ORDER — LIDOCAINE HCL (CARDIAC) PF 100 MG/5ML IV SOSY
PREFILLED_SYRINGE | INTRAVENOUS | Status: DC | PRN
  Administered 2023-09-18: 100 mg via INTRAVENOUS

## 2023-09-18 MED ORDER — KETAMINE HCL 50 MG/5ML IJ SOSY
PREFILLED_SYRINGE | INTRAMUSCULAR | Status: AC
  Filled 2023-09-18: qty 5

## 2023-09-18 MED ORDER — KETAMINE HCL 10 MG/ML IJ SOLN
INTRAMUSCULAR | Status: DC | PRN
  Administered 2023-09-18: 20 mg via INTRAVENOUS

## 2023-09-18 MED ORDER — MEPERIDINE HCL 50 MG/ML IJ SOLN: 6.2500 mg | INTRAMUSCULAR | Status: DC | PRN

## 2023-09-18 MED ORDER — FENTANYL CITRATE PF 50 MCG/ML IJ SOSY: 25.0000 ug | PREFILLED_SYRINGE | INTRAMUSCULAR | Status: DC | PRN

## 2023-09-18 MED ORDER — PROPOFOL 10 MG/ML IV BOLUS
INTRAVENOUS | Status: DC | PRN
  Administered 2023-09-18: 200 mg via INTRAVENOUS

## 2023-09-18 MED ORDER — OXYCODONE HCL 5 MG PO TABS
5.0000 mg | ORAL_TABLET | Freq: Four times a day (QID) | ORAL | 0 refills | Status: AC | PRN
Start: 2023-09-18 — End: ?

## 2023-09-18 MED ORDER — MIDAZOLAM HCL 5 MG/5ML IJ SOLN
INTRAMUSCULAR | Status: DC | PRN
  Administered 2023-09-18: 2 mg via INTRAVENOUS

## 2023-09-18 MED ORDER — ONDANSETRON HCL 4 MG/2ML IJ SOLN
4.0000 mg | Freq: Once | INTRAMUSCULAR | Status: AC
  Administered 2023-09-18: 4 mg via INTRAVENOUS
  Filled 2023-09-18: qty 2

## 2023-09-18 SURGICAL SUPPLY — 42 items
APPLIER CLIP 5 13 M/L LIGAMAX5 (MISCELLANEOUS) IMPLANT
APPLIER CLIP ROT 10 11.4 M/L (STAPLE) IMPLANT
BAG COUNTER SPONGE SURGICOUNT (BAG) IMPLANT
BENZOIN TINCTURE PRP APPL 2/3 (GAUZE/BANDAGES/DRESSINGS) IMPLANT
CABLE HIGH FREQUENCY MONO STRZ (ELECTRODE) IMPLANT
CLIP APPLIE 5 13 M/L LIGAMAX5 (MISCELLANEOUS) IMPLANT
CLIP APPLIE ROT 10 11.4 M/L (STAPLE) IMPLANT
COVER SURGICAL LIGHT HANDLE (MISCELLANEOUS) ×2 IMPLANT
CUTTER FLEX LINEAR 45M (STAPLE) IMPLANT
DRSG TEGADERM 2-3/8X2-3/4 SM (GAUZE/BANDAGES/DRESSINGS) IMPLANT
ELECT REM PT RETURN 15FT ADLT (MISCELLANEOUS) ×2 IMPLANT
GAUZE SPONGE 2X2 8PLY STRL LF (GAUZE/BANDAGES/DRESSINGS) IMPLANT
GLOVE BIO SURGEON STRL SZ7 (GLOVE) ×2 IMPLANT
GLOVE BIOGEL PI IND STRL 7.0 (GLOVE) ×2 IMPLANT
GLOVE BIOGEL PI IND STRL 7.5 (GLOVE) ×2 IMPLANT
GOWN STRL REUS W/ TWL LRG LVL3 (GOWN DISPOSABLE) ×4 IMPLANT
IRRIG SUCT STRYKERFLOW 2 WTIP (MISCELLANEOUS) ×1 IMPLANT
IRRIGATION SUCT STRKRFLW 2 WTP (MISCELLANEOUS) ×2 IMPLANT
KIT BASIN OR (CUSTOM PROCEDURE TRAY) ×2 IMPLANT
KIT TURNOVER KIT A (KITS) IMPLANT
NS IRRIG 1000ML POUR BTL (IV SOLUTION) ×2 IMPLANT
PENCIL SMOKE EVACUATOR (MISCELLANEOUS) IMPLANT
RELOAD 45 VASCULAR/THIN (ENDOMECHANICALS) IMPLANT
RELOAD STAPLE 45 2.5 WHT GRN (ENDOMECHANICALS) IMPLANT
RELOAD STAPLE 45 3.5 BLU ETS (ENDOMECHANICALS) IMPLANT
RELOAD STAPLE TA45 3.5 REG BLU (ENDOMECHANICALS) ×1 IMPLANT
SCISSORS LAP 5X35 DISP (ENDOMECHANICALS) ×2 IMPLANT
SET TUBE SMOKE EVAC HIGH FLOW (TUBING) ×2 IMPLANT
SHEARS HARMONIC 36 ACE (MISCELLANEOUS) IMPLANT
SLEEVE ADV FIXATION 5X100MM (TROCAR) IMPLANT
SLEEVE Z-THREAD 5X100MM (TROCAR) ×2 IMPLANT
SPIKE FLUID TRANSFER (MISCELLANEOUS) ×2 IMPLANT
STRIP CLOSURE SKIN 1/2X4 (GAUZE/BANDAGES/DRESSINGS) ×2 IMPLANT
SUT MNCRL AB 4-0 PS2 18 (SUTURE) ×2 IMPLANT
SUT VICRYL 0 ENDOLOOP (SUTURE) IMPLANT
SYS BAG RETRIEVAL 10MM (BASKET) ×1 IMPLANT
SYSTEM BAG RETRIEVAL 10MM (BASKET) ×2 IMPLANT
TOWEL OR 17X26 10 PK STRL BLUE (TOWEL DISPOSABLE) ×2 IMPLANT
TRAY FOLEY MTR SLVR 16FR STAT (SET/KITS/TRAYS/PACK) IMPLANT
TRAY LAPAROSCOPIC (CUSTOM PROCEDURE TRAY) ×2 IMPLANT
TROCAR BALLN 12MMX100 BLUNT (TROCAR) ×2 IMPLANT
TROCAR Z-THREAD OPTICAL 5X100M (TROCAR) ×2 IMPLANT

## 2023-09-18 NOTE — ED Notes (Signed)
Called [facility] for coordination to [facility] with arrival 61f 16:00.  Spoke with Maisie Fus

## 2023-09-18 NOTE — Anesthesia Procedure Notes (Signed)
Procedure Name: Intubation Date/Time: 09/18/2023 4:36 PM  Performed by: Floydene Flock, CRNAPre-anesthesia Checklist: Patient identified Patient Re-evaluated:Patient Re-evaluated prior to induction Oxygen Delivery Method: Circle system utilized Preoxygenation: Pre-oxygenation with 100% oxygen Induction Type: IV induction Ventilation: Mask ventilation without difficulty Laryngoscope Size: Mac and 3 Grade View: Grade I Tube type: Oral Tube size: 7.5 mm Number of attempts: 1 Airway Equipment and Method: Stylet Placement Confirmation: ETT inserted through vocal cords under direct vision, positive ETCO2 and breath sounds checked- equal and bilateral Secured at: 23 cm Tube secured with: Tape Dental Injury: Teeth and Oropharynx as per pre-operative assessment  Comments: Very deep. Mac 4 would be better.

## 2023-09-18 NOTE — OR Nursing (Signed)
Patient has asymptomatic tachycardia post procedure.  Per Dr Manus Rudd, and Dr Anice Paganini, patient ok to be discharged.  Patient educated that if symptoms develop, he should seek medical attention.  Tildon Husky, RN

## 2023-09-18 NOTE — Anesthesia Preprocedure Evaluation (Signed)
Anesthesia Evaluation  Patient identified by MRN, date of birth, ID band Patient awake    Reviewed: Allergy & Precautions, H&P , NPO status , Patient's Chart, lab work & pertinent test results  Airway Mallampati: I       Dental  (+) Poor Dentition   Pulmonary Current SmokerPatient did not abstain from smoking.   Pulmonary exam normal        Cardiovascular negative cardio ROS Normal cardiovascular exam     Neuro/Psych negative neurological ROS     GI/Hepatic negative GI ROS,,,(+) Hepatitis -, Unspecified  Endo/Other  negative endocrine ROS    Renal/GU negative Renal ROS  negative genitourinary   Musculoskeletal negative musculoskeletal ROS (+)    Abdominal Normal abdominal exam  (+)   Peds negative pediatric ROS (+)  Hematology negative hematology ROS (+)   Anesthesia Other Findings   Reproductive/Obstetrics negative OB ROS                             Anesthesia Physical Anesthesia Plan  ASA: 2  Anesthesia Plan: General   Post-op Pain Management:    Induction: Intravenous  PONV Risk Score and Plan: 3 and Ondansetron, Dexamethasone, Midazolam and Treatment may vary due to age or medical condition  Airway Management Planned: Oral ETT  Additional Equipment: None  Intra-op Plan:   Post-operative Plan: Extubation in OR  Informed Consent: I have reviewed the patients History and Physical, chart, labs and discussed the procedure including the risks, benefits and alternatives for the proposed anesthesia with the patient or authorized representative who has indicated his/her understanding and acceptance.     Dental advisory given  Plan Discussed with: CRNA  Anesthesia Plan Comments:        Anesthesia Quick Evaluation

## 2023-09-18 NOTE — ED Triage Notes (Signed)
Pt reports abd pain, chills, vomit, and weakness that developed yesterday.

## 2023-09-18 NOTE — Transfer of Care (Signed)
Immediate Anesthesia Transfer of Care Note  Patient: [name] Frey  Procedure(s) Performed: APPENDECTOMY LAPAROSCOPIC  Patient Location: PACU  Anesthesia Type:General  Level of Consciousness: awake, alert , and oriented  Airway & Oxygen Therapy: Patient Spontanous Breathing and Patient connected to face mask oxygen  Post-op Assessment: Report given to RN and Post -op Vital signs reviewed and stable  Post vital signs: Reviewed and stable  Last Vitals:  Vitals Value Taken Time  BP 132/82   Temp    Pulse 140   Resp 16   SpO2 100     Last Pain:  Vitals:   09/18/23 1453  TempSrc: Oral  PainSc: 5       Patients Stated Pain Goal: 4 (09/18/23 1453)  Complications: No notable events documented.

## 2023-09-18 NOTE — Discharge Instructions (Addendum)

## 2023-09-18 NOTE — Anesthesia Postprocedure Evaluation (Signed)
Anesthesia Post Note  Patient: Lenin Icenhower  Procedure(s) Performed: APPENDECTOMY LAPAROSCOPIC     Patient location during evaluation: PACU Anesthesia Type: General Level of consciousness: awake and alert Pain management: pain level controlled Vital Signs Assessment: post-procedure vital signs reviewed and stable Respiratory status: spontaneous breathing, nonlabored ventilation, respiratory function stable and patient connected to nasal cannula oxygen Cardiovascular status: blood pressure returned to baseline and stable Postop Assessment: no apparent nausea or vomiting Anesthetic complications: no   No notable events documented.  Last Vitals:  Vitals:   09/18/23 1800 09/18/23 1815  BP: 119/70 121/66  Pulse: (!) 132 (!) 130  Resp: 13 17  Temp:    SpO2: 100% 99%    Last Pain:  Vitals:   09/18/23 1815  TempSrc:   PainSc: 0-No pain                 [place]

## 2023-09-18 NOTE — H&P (Signed)
[name] Frey 01-14-1991  [id number].    Requesting MD: [name] Core  MD Chief Complaint/Reason for Consult: appendicitis   HPI:  [name] Frey is a 32 y/o M with a PMH asthma, HTN, migraines, elevated LFTs, psychoactive substance-induced psychosis, and polysubstance use who presents with acute onset abdominal pain. States pain started at midnight and is most severe in his right lower abdomen. Associated symptoms include chills, nausea, and vomiting. He denies sick contacts, diarrhea, melena, hematochezia. States he did have a small streak of clotted blood in his vomit once. He denies a history of abdominal surgery. He reports daily cigarette use. Reports occasional alcohol use (monthly). Denies other drug use - last used heroin 11/2022 per his report. Denies the use of blood thinners. NKDA. Lives at home with his parents.  ROS: Review of Systems  All other systems reviewed and are negative.   Family History  Problem Relation Age of Onset   Hypertension Mother    Other Brother        Epilepsy   Sleep apnea Father     Past Medical History:  Diagnosis Date   Asthma    Collar bone fracture    Migraines     Past Surgical History:  Procedure Laterality Date   FRACTURE SURGERY      Social History:  reports that he has been smoking cigarettes. He has a 1.5 pack-year smoking history. He has never used smokeless tobacco. He reports current alcohol use. He reports current drug use. Drug: Marijuana.  Allergies: No Known Allergies  (Not in a hospital admission)    Physical Exam: Blood pressure (!) 143/96, pulse (!) 105, temperature 97.6 F (36.4 C), temperature source Oral, resp. rate 18, SpO2 100%. General: cooperative male laying on hospital bed, appears stated age, NAD. HEENT: head -normocephalic, atraumatic; Eyes: PERRLA, no conjunctival injection, anicteric sclerae Neck- Trachea is midline CV- RRR, normal S1/S2, no M/R/G,no lower extremity edema  Pulm- breathing  is non-labored ORA. CTABL, no wheezes, rhales, rhonchi. Abd- soft, TTP RLQ without peritonitis, no palpable masses or hernias GU- deferred  MSK- UE/LE symmetrical, no cyanosis, clubbing, or edema. Neuro- CN II-XII grossly in tact, no paresthesias. Psych- Alert and Oriented x3 with anxious affect Skin: warm and dry, no rashes or lesions   Results for orders placed or performed during the hospital encounter of 09/18/23 (from the past 48 hours)  Lipase, blood     Status: None   Collection Time: 09/18/23  9:04 AM  Result Value Ref Range   Lipase 32 11 - 51 U/L    Comment: Performed at [facility], [address] [place], [place] [address]  Comprehensive metabolic panel     Status: Abnormal   Collection Time: 09/18/23  9:04 AM  Result Value Ref Range   Sodium 135 135 - 145 mmol/L   Potassium 4.2 3.5 - 5.1 mmol/L   Chloride 102 98 - 111 mmol/L   CO2 23 22 - 32 mmol/L   Glucose, Bld 176 (H) 70 - 99 mg/dL    Comment: Glucose reference range applies only to samples taken after fasting for at least 8 hours.   BUN 13 6 - 20 mg/dL   Creatinine, Ser 8.41 0.61 - 1.24 mg/dL   Calcium 9.5 8.9 - 32.4 mg/dL   Total Protein 7.8 6.5 - 8.1 g/dL   Albumin 4.5 3.5 - 5.0 g/dL   AST 86 (H) 15 - 41 U/L   ALT 182 (H) 0 - 44  U/L   Alkaline Phosphatase 57 38 - 126 U/L   Total Bilirubin 0.7 <1.2 mg/dL   GFR, Estimated >04 >54 mL/min    Comment: (NOTE) Calculated using the CKD-EPI Creatinine Equation (2021)    Anion gap 10 5 - 15    Comment: Performed at [facility], [address] [place], [place] [address]  CBC     Status: Abnormal   Collection Time: 09/18/23  9:04 AM  Result Value Ref Range   WBC 18.4 (H) 4.0 - 10.5 K/uL   RBC 4.91 4.22 - 5.81 MIL/uL   Hemoglobin 15.2 13.0 - 17.0 g/dL   HCT 91.4 78.2 - 95.6 %   MCV 89.8 80.0 - 100.0 fL   MCH 31.0 26.0 - 34.0 pg   MCHC 34.5 30.0 - 36.0 g/dL   RDW 21.3 08.6 - 57.8 %   Platelets 335 150 - 400 K/uL   nRBC 0.0 0.0 - 0.2 %     Comment: Performed at [facility], [address] [place], [place] [address]  Urinalysis, Routine w reflex microscopic -Urine, Clean Catch     Status: Abnormal   Collection Time: 09/18/23  9:05 AM  Result Value Ref Range   Color, Urine YELLOW YELLOW   APPearance CLEAR CLEAR   Specific Gravity, Urine >=1.030 1.005 - 1.030   pH 6.5 5.0 - 8.0   Glucose, UA NEGATIVE NEGATIVE mg/dL   Hgb urine dipstick NEGATIVE NEGATIVE   Bilirubin Urine NEGATIVE NEGATIVE   Ketones, ur 15 (A) NEGATIVE mg/dL   Protein, ur 30 (A) NEGATIVE mg/dL   Nitrite NEGATIVE NEGATIVE   Leukocytes,Ua NEGATIVE NEGATIVE    Comment: Performed at [facility], [address] [place], [place] [address]  Urinalysis, Microscopic (reflex)     Status: Abnormal   Collection Time: 09/18/23  9:05 AM  Result Value Ref Range   RBC / HPF 0-5 0 - 5 RBC/hpf   WBC, UA 0-5 0 - 5 WBC/hpf   Bacteria, UA RARE (A) NONE SEEN   Squamous Epithelial / HPF 0-5 0 - 5 /HPF    Comment: Performed at [facility], [address] [place], [place] [address]   CT ABDOMEN PELVIS W CONTRAST Result Date: 09/18/2023 CLINICAL DATA:  Abdominal pain, acute, nonlocalized. Abdominal pain. Chills. Vomiting. Weakness. EXAM: CT ABDOMEN AND PELVIS WITH CONTRAST TECHNIQUE: Multidetector CT imaging of the abdomen and pelvis was performed using the standard protocol following bolus administration of intravenous contrast. RADIATION DOSE REDUCTION: This exam was performed according to the departmental dose-optimization program which includes automated exposure control, adjustment of the mA and/or kV according to patient size and/or use of iterative reconstruction technique. CONTRAST:  OMNIPAQUE IOHEXOL 300 MG/ML  SOLN COMPARISON:  None Available. FINDINGS: Lower chest: The lung bases are clear. No pleural effusion. The heart is normal in size. No pericardial effusion. Hepatobiliary: The liver is normal in size. Non-cirrhotic  configuration. No suspicious mass. These is mild diffuse hepatic steatosis. No intrahepatic or extrahepatic bile duct dilation. No calcified gallstones. Normal gallbladder wall thickness. No pericholecystic inflammatory changes. Pancreas: Unremarkable. No pancreatic ductal dilatation or surrounding inflammatory changes. Spleen: Within normal limits. No focal lesion. Adrenals/Urinary Tract: Adrenal glands are unremarkable. No suspicious renal mass. No hydronephrosis. No renal or ureteric calculi. Unremarkable urinary bladder. Stomach/Bowel: There is a small sliding hiatal hernia. No disproportionate dilation of the small or large bowel loops. No evidence of abnormal bowel wall thickening or inflammatory  changes. The appendix is enlarged measuring up to 1.3 cm in diameter. There is mild-to-moderate periappendiceal fat stranding. There are 2 appendicoliths in the tip with largest measuring up to 6 x 13 mm. Findings are compatible with acute uncomplicated appendicitis. The base is nondilated. Vascular/Lymphatic: No ascites or pneumoperitoneum. No abdominal or pelvic lymphadenopathy, by size criteria. No aneurysmal dilation of the major abdominal arteries. Reproductive: Normal size prostate. Symmetric seminal vesicles. Other: There is a tiny fat containing umbilical hernia. The soft tissues and abdominal wall are otherwise unremarkable. Musculoskeletal: No suspicious osseous lesions. IMPRESSION: *Findings described above, compatible with acute uncomplicated appendicitis. *Multiple other nonacute observations, as described above. Electronically Signed   By: Jules Schick M.D.   On: 09/18/2023 11:46      Assessment/Plan Acute appendicitis with appendicoliths, without evidence of perforation  The operative and non-operative management of appendicitis was discussed with the patient. Risks of surgery including bleeding, infection, damage to surrounding structures, conversion to open, drain placement, need for  additional procedures, prolonged hospital stay, as well as the risks of general anesthesia were discussed with the patient and he would like to proceed with surgery. Questions were welcomed and answered.   FEN - NPO, IVF VTE - SCD's ID - Rocephin/Flagyl Admit - Plan to discharge from PACU vs admit for observation pending outcome of surgery   Asthma HTN Transaminitis - AST 86, ALT 182, stable compared to 4 months ago. Had a RUQ U/S at the time that was normal, no cholelithiasis. Mild diffuse hepatic steatosis on CT. Tobacco use    I reviewed nursing notes, ED provider notes, last 24 h vitals and pain scores, last 48 h intake and output, last 24 h labs and trends, and last 24 h imaging results.  HIGH MDM   Adam Phenix, PA-C [facility] 09/18/2023, 12:30 PM Please see Amion for pager number during day hours 7:00am-4:30pm or 7:00am -11:30am on weekends

## 2023-09-18 NOTE — ED Provider Notes (Signed)
[facility] Provider Note   CSN: [id number] Arrival date & time: 09/18/23  8295     History  Chief Complaint  Patient presents with   Emesis    Ha [name] Frey is a 32 y.o. male with medical history of migraines, nausea and vomiting, psychoactive substance induced psychosis, dextromethorphan use disorder, heroin abuse.  Patient presents to ED for evaluation of nausea and vomiting associate with abdominal pain.  Reports that around midnight last night his symptoms began.  States that he has vomited numerous times, cannot provide me at number.  Reporting abdominal pain also around his umbilicus, lower abdomen.  Denies any flank pain, dysuria, penile discharge, fevers at home.  Is endorsing body aches and chills.  Denies any known sick contacts.  Denies any undercooked or suspicious foods recently ingested.  Reports a history of the same about 1.5 years ago.  Denies alcohol or drugs.  Denies any diarrhea.   Emesis Associated symptoms: abdominal pain, chills and myalgias   Associated symptoms: no diarrhea and no fever        Home Medications Prior to Admission medications   Medication Sig Start Date End Date Taking? Authorizing Provider  hydrocortisone cream 1 % Apply to affected area 2 times daily DO NOT APPLY TO FACE 03/20/20   Fondaw, Stevphen Meuse S, PA  lisinopril (ZESTRIL) 10 MG tablet Take 10 mg by mouth daily.    [provider]  ondansetron (ZOFRAN-ODT) 4 MG disintegrating tablet Take 1 tablet (4 mg total) by mouth every 8 (eight) hours as needed for nausea or vomiting. 05/18/23   Terrilee Files, MD  PARoxetine (PAXIL) 20 MG tablet Take 20 mg by mouth daily. 01/13/20   [provider]      Allergies    Patient has no known allergies.    Review of Systems   Review of Systems  Constitutional:  Positive for chills. Negative for fever.  Gastrointestinal:  Positive for abdominal pain, nausea and vomiting. Negative for  diarrhea.  Genitourinary:  Negative for dysuria and penile discharge.  Musculoskeletal:  Positive for myalgias.  All other systems reviewed and are negative.   Physical Exam Updated Vital Signs BP 136/77   Pulse (!) 109   Temp 97.6 F (36.4 C) (Oral)   Resp 18   SpO2 99%  Physical Exam Vitals and nursing note reviewed.  Constitutional:      General: He is not in acute distress.    Appearance: Normal appearance. He is not ill-appearing, toxic-appearing or diaphoretic.  HENT:     Head: Normocephalic and atraumatic.     Nose: Nose normal.     Mouth/Throat:     Mouth: Mucous membranes are moist.     Pharynx: Oropharynx is clear.  Eyes:     Extraocular Movements: Extraocular movements intact.     Conjunctiva/sclera: Conjunctivae normal.     Pupils: Pupils are equal, round, and reactive to light.  Cardiovascular:     Rate and Rhythm: Normal rate and regular rhythm.  Pulmonary:     Effort: Pulmonary effort is normal.     Breath sounds: Normal breath sounds. No wheezing.  Abdominal:     General: Abdomen is flat. Bowel sounds are normal.     Palpations: Abdomen is soft.     Tenderness: There is abdominal tenderness.  Musculoskeletal:     Cervical back: Normal range of motion and neck supple. No tenderness.  Skin:    General: Skin is warm and dry.  Capillary Refill: Capillary refill takes less than 2 seconds.  Neurological:     Mental Status: He is alert and oriented to person, place, and time.     ED Results / Procedures / Treatments   Labs (all labs ordered are listed, but only abnormal results are displayed) Labs Reviewed  COMPREHENSIVE METABOLIC PANEL - Abnormal; Notable for the following components:      Result Value   Glucose, Bld 176 (*)    AST 86 (*)    ALT 182 (*)    All other components within normal limits  CBC - Abnormal; Notable for the following components:   WBC 18.4 (*)    All other components within normal limits  URINALYSIS, ROUTINE W REFLEX  MICROSCOPIC - Abnormal; Notable for the following components:   Ketones, ur 15 (*)    Protein, ur 30 (*)    All other components within normal limits  URINALYSIS, MICROSCOPIC (REFLEX) - Abnormal; Notable for the following components:   Bacteria, UA RARE (*)    All other components within normal limits  LIPASE, BLOOD    EKG None  Radiology CT ABDOMEN PELVIS W CONTRAST Result Date: 09/18/2023 CLINICAL DATA:  Abdominal pain, acute, nonlocalized. Abdominal pain. Chills. Vomiting. Weakness. EXAM: CT ABDOMEN AND PELVIS WITH CONTRAST TECHNIQUE: Multidetector CT imaging of the abdomen and pelvis was performed using the standard protocol following bolus administration of intravenous contrast. RADIATION DOSE REDUCTION: This exam was performed according to the departmental dose-optimization program which includes automated exposure control, adjustment of the mA and/or kV according to patient size and/or use of iterative reconstruction technique. CONTRAST:  OMNIPAQUE IOHEXOL 300 MG/ML  SOLN COMPARISON:  None Available. FINDINGS: Lower chest: The lung bases are clear. No pleural effusion. The heart is normal in size. No pericardial effusion. Hepatobiliary: The liver is normal in size. Non-cirrhotic configuration. No suspicious mass. These is mild diffuse hepatic steatosis. No intrahepatic or extrahepatic bile duct dilation. No calcified gallstones. Normal gallbladder wall thickness. No pericholecystic inflammatory changes. Pancreas: Unremarkable. No pancreatic ductal dilatation or surrounding inflammatory changes. Spleen: Within normal limits. No focal lesion. Adrenals/Urinary Tract: Adrenal glands are unremarkable. No suspicious renal mass. No hydronephrosis. No renal or ureteric calculi. Unremarkable urinary bladder. Stomach/Bowel: There is a small sliding hiatal hernia. No disproportionate dilation of the small or large bowel loops. No evidence of abnormal bowel wall thickening or inflammatory changes.  The appendix is enlarged measuring up to 1.3 cm in diameter. There is mild-to-moderate periappendiceal fat stranding. There are 2 appendicoliths in the tip with largest measuring up to 6 x 13 mm. Findings are compatible with acute uncomplicated appendicitis. The base is nondilated. Vascular/Lymphatic: No ascites or pneumoperitoneum. No abdominal or pelvic lymphadenopathy, by size criteria. No aneurysmal dilation of the major abdominal arteries. Reproductive: Normal size prostate. Symmetric seminal vesicles. Other: There is a tiny fat containing umbilical hernia. The soft tissues and abdominal wall are otherwise unremarkable. Musculoskeletal: No suspicious osseous lesions. IMPRESSION: *Findings described above, compatible with acute uncomplicated appendicitis. *Multiple other nonacute observations, as described above. Electronically Signed   By: Jules Schick M.D.   On: 09/18/2023 11:46    Procedures .Critical Care  Performed by: Al Decant, PA-C Authorized by: Al Decant, PA-C   Critical care provider statement:    Critical care time (minutes):  75   Critical care time was exclusive of:  Separately billable procedures and treating other patients   Critical care was necessary to treat or prevent imminent or life-threatening deterioration of  the following conditions:  Sepsis   Critical care was time spent personally by me on the following activities:  Blood draw for specimens, development of treatment plan with patient or surrogate, discussions with consultants, discussions with primary provider, evaluation of patient's response to treatment, examination of patient, interpretation of cardiac output measurements, obtaining history from patient or surrogate, ordering and performing treatments and interventions, ordering and review of laboratory studies, ordering and review of radiographic studies, pulse oximetry, re-evaluation of patient's condition and review of old charts   I assumed  direction of critical care for this patient from another provider in my specialty: no     Care discussed with: admitting provider      Medications Ordered in ED Medications  sodium chloride 0.9 % bolus 1,000 mL (0 mLs Intravenous Stopped 09/18/23 1304)    Followed by  0.9 %  sodium chloride infusion (1,000 mLs Intravenous New Bag/Given 09/18/23 1252)  ondansetron (ZOFRAN) injection 4 mg (4 mg Intravenous Given 09/18/23 0921)  ketorolac (TORADOL) 15 MG/ML injection 15 mg (15 mg Intravenous Given 09/18/23 0931)  capsaicin (ZOSTRIX) 0.025 % cream ( Topical Given 09/18/23 0931)  dicyclomine (BENTYL) injection 20 mg (20 mg Intramuscular Given 09/18/23 0935)  sodium chloride 0.9 % bolus 1,000 mL (0 mLs Intravenous Stopped 09/18/23 1148)  iohexol (OMNIPAQUE) 300 MG/ML solution 100 mL (100 mLs Intravenous Contrast Given 09/18/23 1049)  cefTRIAXone (ROCEPHIN) 2 g in sodium chloride 0.9 % 100 mL IVPB (0 g Intravenous Stopped 09/18/23 1245)    And  metroNIDAZOLE (FLAGYL) IVPB 500 mg (500 mg Intravenous New Bag/Given 09/18/23 1247)  ondansetron (ZOFRAN) injection 4 mg (4 mg Intravenous Given 09/18/23 1214)  morphine (PF) 2 MG/ML injection 2 mg (2 mg Intravenous Given 09/18/23 1214)  alum & mag hydroxide-simeth (MAALOX/MYLANTA) 200-200-20 MG/5ML suspension 30 mL (30 mLs Topical Given 09/18/23 1344)    ED Course/ Medical Decision Making/ A&P Clinical Course as of 09/18/23 1425  Mon Sep 18, 2023  1156 CT ABDOMEN PELVIS W CONTRAST [CG]  1200 No oral intake since yesterday. [CG]  1224 Pre-op at [place] [CG]    Clinical Course User Index [CG] Al Decant, PA-C   Medical Decision Making Amount and/or Complexity of Data Reviewed Labs: ordered.  Risk Prescription drug management.   32 year old male presents for abdominal pain, nausea and vomiting.  Please see HPI for further details.  On exam patient is afebrile, tachycardic to 110.  Lung sounds are clear bilaterally, he is not  hypoxic.  Abdomen has tenderness throughout, predominantly located in right lower quadrant.  Neurological examination is at baseline.  Will collect labs and scan patient with CT abdomen pelvis.  Will provide patient Toradol, Bentyl and capsaicin cream for abdominal pain.  CBC with leukocytosis 18.4, hemoglobin stable.  Lipase WNL.  Urinalysis shows ketones and protein.  CMP with elevated AST, ALT however AST, ALT are reduced compared to 4 months ago.  CT abdomen pelvis shows an acute uncomplicated appendicitis.  Patient started on empiric antibiotics ceftriaxone, metronidazole.  Reached out to general surgery.  Spoke with [employer].  She states that they will post the patient in the OR for a 4 PM surgery.  Have contacted [facility] for transfer.  Patient placed on maintenance was at this time after receiving 1 L bolus of fluid.  Patient was given second round of States by nursing staff.  Patient reported burning and redness to his abdomen at this time.  No itching was noted.  Patient provided with topical Maalox.  Patient transferred on the floor to [facility] for appendectomy.  Final Clinical Impression(s) / ED Diagnoses Final diagnoses:  Acute appendicitis, unspecified acute appendicitis type    Rx / DC Orders ED Discharge Orders     None         Al Decant, PA-C 09/18/23 1425    Benjiman Core, MD 09/19/23 1615

## 2023-09-18 NOTE — Op Note (Signed)
Appendectomy, Lap, Procedure Note  Indications: The patient presented with a history of right-sided abdominal pain. A CT scan revealed findings consistent with acute appendicitis.  Pre-operative Diagnosis: Acute appendicitis without mention of peritonitis  Post-operative Diagnosis: Same  Surgeon: Wynona Luna   Assistants: none  Anesthesia: General endotracheal anesthesia  ASA Class: 2  Procedure Details  The patient was seen again in the Holding Room. The risks, benefits, complications, treatment options, and expected outcomes were discussed with the patient and/or family. The possibilities of reaction to medication, perforation of viscus, bleeding, recurrent infection, finding a normal appendix, the need for additional procedures, failure to diagnose a condition, and creating a complication requiring transfusion or operation were discussed. There was concurrence with the proposed plan and informed consent was obtained. The site of surgery was properly noted. The patient was taken to Operating Room, identified as [name] Frey and the procedure verified as Appendectomy. A Time Out was held and the above information confirmed.  The patient was placed in the supine position and general anesthesia was induced.  The abdomen was prepped and draped in a sterile fashion. A one centimeter infraumbilical incision was made.  Dissection was carried down to the fascia bluntly.  The fascia was incised vertically.  We entered the peritoneal cavity bluntly.  A pursestring suture was passed around the fascial opening with a 0 Vicryl.  The Hasson cannula was introduced into the abdomen and the tails of the suture were used to hold the Hasson in place.   The pneumoperitoneum was then established maintaining a maximum pressure of 15 mmHg.  Additional 5 mm cannulas then placed in the left lower quadrant of the abdomen and the epigastrium under direct visualization. A careful evaluation of the entire abdomen was  carried out. The patient was placed in Trendelenburg and left lateral decubitus position.  The scope was moved to the right upper quadrant port site. The cecum was mobilized medially.  The right colon and cecum are quite mobile.  The appendix was thickened and inflamed, but there was no sign of perforation.  The appendix was carefully dissected. The appendix was skeletonized with the harmonic scalpel.   The appendix was divided at its base using an endo-GIA stapler. No appendiceal stump was left in place. There was no evidence of bleeding, leakage, or complication after division of the appendix. Irrigation was also performed and irrigate suctioned from the abdomen as well.  The umbilical port site was closed with the purse string suture. There was no residual palpable fascial defect.  The trocar site skin wounds were closed with 4-0 Monocryl.  Instrument, sponge, and needle counts were correct at the conclusion of the case.   Findings: The appendix was found to be inflamed. There were not signs of necrosis.  There was not perforation. There was not abscess formation.  Estimated Blood Loss:  Minimal         Drains: none         Specimens: appendix         Complications:  None; patient tolerated the procedure well.         Disposition: PACU - hemodynamically stable.         Condition: stable  [name] Frey. [name] Skains, MD, [facility] Surgery  General Surgery   09/18/2023 5:39 PM

## 2023-09-19 ENCOUNTER — Encounter (HOSPITAL_COMMUNITY): Payer: Self-pay | Admitting: Surgery

## 2023-09-19 NOTE — Addendum Note (Signed)
 Addendum  created 09/19/23 1756 by Stonewall Nation, MD   Intraprocedure Staff edited

## 2023-09-21 LAB — SURGICAL PATHOLOGY

## 2024-01-05 ENCOUNTER — Other Ambulatory Visit: Payer: Self-pay

## 2024-01-05 ENCOUNTER — Emergency Department (HOSPITAL_COMMUNITY)
Admission: EM | Admit: 2024-01-05 | Discharge: 2024-01-05 | Disposition: A | Discharge status: Home / Self Care | Attending: Emergency Medicine | Admitting: Emergency Medicine

## 2024-01-05 ENCOUNTER — Encounter (HOSPITAL_COMMUNITY): Payer: Self-pay | Admitting: *Deleted

## 2024-01-05 DIAGNOSIS — R5383 Other fatigue: Secondary | ICD-10-CM | POA: Insufficient documentation

## 2024-01-05 DIAGNOSIS — R110 Nausea: Secondary | ICD-10-CM | POA: Diagnosis present

## 2024-01-05 DIAGNOSIS — R5381 Other malaise: Secondary | ICD-10-CM | POA: Diagnosis not present

## 2024-01-05 DIAGNOSIS — Z8616 Personal history of COVID-19: Secondary | ICD-10-CM | POA: Insufficient documentation

## 2024-01-05 LAB — CBC
HCT: 46.9 % (ref 39.0–52.0)
Hemoglobin: 15.7 g/dL (ref 13.0–17.0)
MCH: 30.3 pg (ref 26.0–34.0)
MCHC: 33.5 g/dL (ref 30.0–36.0)
MCV: 90.4 fL (ref 80.0–100.0)
Platelets: 297 10*3/uL (ref 150–400)
RBC: 5.19 MIL/uL (ref 4.22–5.81)
RDW: 12.8 % (ref 11.5–15.5)
WBC: 8.4 10*3/uL (ref 4.0–10.5)
nRBC: 0 % (ref 0.0–0.2)

## 2024-01-05 LAB — BASIC METABOLIC PANEL WITH GFR
Anion gap: 10 (ref 5–15)
BUN: 8 mg/dL (ref 6–20)
CO2: 22 mmol/L (ref 22–32)
Calcium: 9.5 mg/dL (ref 8.9–10.3)
Chloride: 104 mmol/L (ref 98–111)
Creatinine, Ser: 0.9 mg/dL (ref 0.61–1.24)
GFR, Estimated: >60 mL/min (ref >60)
Glucose, Bld: 92 mg/dL (ref 70–99)
Potassium: 4.2 mmol/L (ref 3.5–5.1)
Sodium: 136 mmol/L (ref 135–145)

## 2024-01-05 NOTE — ED Triage Notes (Signed)
 Pt states that he has been feeling generally unwell for the past month and a half since having covid.  Pt describes a lack of appetitive, nausea without vomiting and feeling restless and fatigued.  Pt reports that he has missed work for a month and a half. Fortunately pt appears in no distress in triage.

## 2024-01-05 NOTE — ED Provider Notes (Signed)
 [facility] Provider Note   CSN: [id number] Arrival date & time: 01/05/24  1615     History  Chief Complaint  Patient presents with   Nausea    Cadel Stairs is a 33 y.o. male.  33 year old male presenting with complaint of malaise.  Patient recently diagnosed with COVID about 1 month ago, since then he describes generalized fatigue, occasional nausea, restlessness.  Patient has history of auditory hallucinations, he notes that these have been worse lately and are exacerbated by use of marijuana, cocaine, and meth. He was medicated in the past for auditory hallucinations but has been unmedicated for sometime, as he was unable to fill his prescriptions previously. He denies IVDU. He denies SI/HI, visual hallucinations. He denies vomiting, abdominal pain, shortness of breath, chest pain, LE edema, fever.         Home Medications Prior to Admission medications   Medication Sig Start Date End Date Taking? Authorizing Provider  acetaminophen  (TYLENOL ) 500 MG tablet Take 2 tablets (1,000 mg total) by mouth every 6 (six) hours as needed. 09/18/23 09/17/24  Charlott Converse, PA-C  hydrocortisone  cream 1 % Apply to affected area 2 times daily DO NOT APPLY TO FACE 03/20/20   Fondaw, Melda Spore, PA  ibuprofen  (ADVIL ) 200 MG tablet Take 3 tablets (600 mg total) by mouth every 8 (eight) hours as needed for mild pain (pain score 1-3). 09/18/23   Charlott Converse, PA-C  lisinopril (ZESTRIL) 10 MG tablet Take 10 mg by mouth daily.    [provider]  ondansetron  (ZOFRAN -ODT) 4 MG disintegrating tablet Take 1 tablet (4 mg total) by mouth every 8 (eight) hours as needed for nausea or vomiting. 05/18/23   Tonya Fredrickson, MD  oxyCODONE  (ROXICODONE ) 5 MG immediate release tablet Take 1 tablet (5 mg total) by mouth every 6 (six) hours as needed for severe pain (pain score 7-10) (not releived by tylenol  or advil ). 09/18/23   Charlott Converse, PA-C   PARoxetine (PAXIL) 20 MG tablet Take 20 mg by mouth daily. 01/13/20   [provider]      Allergies    Patient has no known allergies.    Review of Systems   Review of Systems  Constitutional:  Positive for fatigue.  Respiratory:  Negative for cough and shortness of breath.   Cardiovascular:  Negative for chest pain.  Gastrointestinal:  Positive for nausea. Negative for abdominal pain and vomiting.  Psychiatric/Behavioral:  Positive for hallucinations (auditory).     Physical Exam Updated Vital Signs BP 128/86   Pulse 92   Temp 98.8 F (37.1 C) (Oral)   Resp 16   SpO2 100%  Physical Exam Vitals and nursing note reviewed.  HENT:     Head: Normocephalic and atraumatic.  Eyes:     Extraocular Movements: Extraocular movements intact.     Pupils: Pupils are equal, round, and reactive to light.  Cardiovascular:     Rate and Rhythm: Normal rate.  Pulmonary:     Effort: Pulmonary effort is normal.     Breath sounds: Normal breath sounds.  Skin:    General: Skin is warm and dry.  Neurological:     Mental Status: He is alert.     ED Results / Procedures / Treatments   Labs (all labs ordered are listed, but only abnormal results are displayed) Labs Reviewed  CBC  BASIC METABOLIC PANEL WITH GFR    EKG None  Radiology No results found.  Procedures Procedures    Medications Ordered in ED Medications - No data to display  ED Course/ Medical Decision Making/ A&P Clinical Course as of 01/05/24 2122  Fri Jan 05, 2024  2101 CFS [CC]    Clinical Course User Index [CC] Onetha Bile, MD                                 Medical Decision Making 33 year old male presenting with complaint of generalized malaise.   CBC and CMP are unremarkable.  Patient expresses concern for worsening auditory hallucinations, requests outpatient psychiatric evaluation for medical management of these symptoms. He denies SI/HI, he does not appear to be acutely psychotic.  I do not believe the patient requires emergency psychiatry evaluation, will provide information about [facility] for further assessment. Patient is in agreement with this plan.   Amount and/or Complexity of Data Reviewed Labs: ordered.    Details: I personally reviewed and interpreted the patient's lab results.            Final Clinical Impression(s) / ED Diagnoses Final diagnoses:  Nausea    Rx / DC Orders ED Discharge Orders     None         Adolm Ahumada 01/05/24 2123    Onetha Bile, MD 01/11/24 1626

## 2024-05-08 ENCOUNTER — Emergency Department (HOSPITAL_COMMUNITY): Admission: EM | Admit: 2024-05-08 | Discharge: 2024-05-08 | Attending: Student | Admitting: Student

## 2024-05-08 ENCOUNTER — Other Ambulatory Visit: Payer: Self-pay

## 2024-05-08 DIAGNOSIS — J45909 Unspecified asthma, uncomplicated: Secondary | ICD-10-CM | POA: Diagnosis not present

## 2024-05-08 DIAGNOSIS — F1990 Other psychoactive substance use, unspecified, uncomplicated: Secondary | ICD-10-CM | POA: Insufficient documentation

## 2024-05-08 DIAGNOSIS — R519 Headache, unspecified: Secondary | ICD-10-CM | POA: Insufficient documentation

## 2024-05-08 DIAGNOSIS — F17210 Nicotine dependence, cigarettes, uncomplicated: Secondary | ICD-10-CM | POA: Insufficient documentation

## 2024-05-08 LAB — CBC WITH DIFFERENTIAL/PLATELET
Abs Immature Granulocytes: 0.03 K/uL (ref 0.00–0.07)
Basophils Absolute: 0.1 K/uL (ref 0.0–0.1)
Basophils Relative: 1 %
Eosinophils Absolute: 0.3 K/uL (ref 0.0–0.5)
Eosinophils Relative: 3 %
HCT: 50.1 % (ref 39.0–52.0)
Hemoglobin: 16.3 g/dL (ref 13.0–17.0)
Immature Granulocytes: 0 %
Lymphocytes Relative: 40 %
Lymphs Abs: 3.7 K/uL (ref 0.7–4.0)
MCH: 30 pg (ref 26.0–34.0)
MCHC: 32.5 g/dL (ref 30.0–36.0)
MCV: 92.3 fL (ref 80.0–100.0)
Monocytes Absolute: 0.9 K/uL (ref 0.1–1.0)
Monocytes Relative: 10 %
Neutro Abs: 4.1 K/uL (ref 1.7–7.7)
Neutrophils Relative %: 46 %
Platelets: 430 K/uL — ABNORMAL HIGH (ref 150–400)
RBC: 5.43 MIL/uL (ref 4.22–5.81)
RDW: 13.4 % (ref 11.5–15.5)
WBC: 9.1 K/uL (ref 4.0–10.5)
nRBC: 0 % (ref 0.0–0.2)

## 2024-05-08 LAB — BASIC METABOLIC PANEL WITH GFR
Anion gap: 13 (ref 5–15)
BUN: 8 mg/dL (ref 6–20)
CO2: 22 mmol/L (ref 22–32)
Calcium: 9.5 mg/dL (ref 8.9–10.3)
Chloride: 111 mmol/L (ref 98–111)
Creatinine, Ser: 1.16 mg/dL (ref 0.61–1.24)
GFR, Estimated: >60 mL/min (ref >60)
Glucose, Bld: 94 mg/dL (ref 70–99)
Potassium: 4.5 mmol/L (ref 3.5–5.1)
Sodium: 146 mmol/L — ABNORMAL HIGH (ref 135–145)

## 2024-05-08 LAB — RAPID URINE DRUG SCREEN, HOSP PERFORMED
Amphetamines: NOT DETECTED
Barbiturates: NOT DETECTED
Benzodiazepines: NOT DETECTED
Cocaine: NOT DETECTED
Opiates: NOT DETECTED
Tetrahydrocannabinol: NOT DETECTED

## 2024-05-08 MED ORDER — ACETAMINOPHEN 500 MG PO TABS
1000.0000 mg | ORAL_TABLET | Freq: Once | ORAL | Status: AC
  Administered 2024-05-08: 1000 mg via ORAL
  Filled 2024-05-08: qty 2

## 2024-05-08 NOTE — ED Triage Notes (Signed)
 BIB [employer]. Pt reports he shot something up that doesn't feel like my normal high and I want to make sure that I didn't overdose.  Pt alert, oriented and chatting appropriately with staff. Breathing easy/unlabored.

## 2024-05-08 NOTE — ED Notes (Signed)
 Pt unable to provide a urine sample right now. Aware that we need one.

## 2024-05-08 NOTE — ED Provider Triage Note (Signed)
 Emergency Medicine Provider Triage Evaluation Note  Arnel Wymer , a 33 y.o. male  was evaluated in triage.  Pt complains of possible drug overdose.  Patient states he shot up something in a blue vial.  Does not know what it was.  Feeling overall sluggish.  Denies any somatic symptoms at this time.  Brought in by [employer]  Review of Systems  Positive:  Negative:   Physical Exam  BP (!) 146/109   Pulse 100   Temp 97.8 F (36.6 C)   Resp 16   Ht 5' 11 (1.803 m)   Wt 68 kg   SpO2 100%   BMI 20.92 kg/m  Gen:   Awake, no distress   Resp:  Normal effort  MSK:   Moves extremities without difficulty  Other:    Medical Decision Making  Medically screening exam initiated at 7:44 PM.  Appropriate orders placed.  Rocio Wolak was informed that the remainder of the evaluation will be completed by another provider, this initial triage assessment does not replace that evaluation, and the importance of remaining in the ED until their evaluation is complete.     Theotis Peers [place], [place] 05/08/24 1945

## 2024-05-09 NOTE — ED Provider Notes (Signed)
 [place] EM[facility]GENCY DEPARTMENT AT [facility] Provider Note  CSN: [id number] Arrival date & time: 05/08/24 1928  Chief Complaint(s) Drug Overdose  HPI [name] Frey is a 33 y.o. male with PMH asthma, polysubstance use who presents emergency room for evaluation of a concern for overdose.  Patient states that approximately 5 hours prior to arrival he injected an unknown substance into his arm.  Patient is concerned that he might be overdosing on fentanyl .  He arrives with a headache but denies chest pain, shortness of breath, Donnell pain, nausea, vomiting.  He is alert and oriented answering all questions appropriately with no evidence of psychosis.   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 Authorizing Provider  acetaminophen  (TYLENOL ) 500 MG tablet Take 2 tablets (1,000 mg total) by mouth every 6 (six) hours as needed. 09/18/23 09/17/24  Augustus Almarie RAMAN, PA-C  hydrocortisone  cream 1 % Apply to affected area 2 times daily DO NOT APPLY TO FACE 03/20/20   Fondaw, Hamp RAMAN, PA  ibuprofen  (ADVIL ) 200 MG tablet Take 3 tablets (600 mg total) by mouth every 8 (eight) hours as needed for mild pain (pain score 1-3). 09/18/23   Augustus Almarie RAMAN, PA-C  lisinopril (ZESTRIL) 10 MG tablet Take 10 mg by mouth daily.    [provider]  ondansetron  (ZOFRAN -ODT) 4 MG disintegrating tablet Take 1 tablet (4 mg total) by mouth every 8 (eight) hours as needed for nausea or vomiting. 05/18/23   Towana Ozell BROCKS, MD   oxyCODONE  (ROXICODONE ) 5 MG immediate release tablet Take 1 tablet (5 mg total) by mouth every 6 (six) hours as needed for severe pain (pain score 7-10) (not releived by tylenol  or advil ). 09/18/23   Augustus Almarie RAMAN, PA-C  PARoxetine (PAXIL) 20 MG tablet Take 20 mg by mouth daily. 01/13/20   [provider]                                                                                                                                    Past Surgical History Past Surgical History:  Procedure Laterality Date   FRACTURE SURGERY     LAPAROSCOPIC APPENDECTOMY N/A 09/18/2023   Procedure: APPENDECTOMY LAPAROSCOPIC;  Surgeon: Belinda Cough, MD;  Location: [facility];  Service: General;  Laterality: N/A;   Family History Family History  Problem Relation Age of Onset   Hypertension Mother    Other Brother  Epilepsy   Sleep apnea Father     Social History Social History   Tobacco Use   Smoking status: Every Day    Current packs/day: 0.50    Average packs/day: 0.5 packs/day for 3.0 years (1.5 ttl pk-yrs)    Types: Cigarettes   Smokeless tobacco: Never  Vaping Use   Vaping status: Never Used  Substance Use Topics   Alcohol use: Yes   Drug use: Yes    Types: Marijuana    Comment: not currently using heroin   Allergies Patient has no known allergies.  Review of Systems Review of Systems  Neurological:  Positive for headaches.    Physical Exam Vital Signs  I have reviewed the triage vital signs BP 128/86 (BP Location: Right Arm)   Pulse 85   Temp 97.8 F (36.6 C) (Oral)   Resp 17   Ht 5' 11 (1.803 m)   Wt 68 kg   SpO2 100%   BMI 20.92 kg/m   Physical Exam Constitutional:      General: He is not in acute distress.    Appearance: Normal appearance.  HENT:     Head: Normocephalic and atraumatic.     Nose: No congestion or rhinorrhea.  Eyes:     General:        Right eye: No discharge.        Left eye: No discharge.     Extraocular Movements:  Extraocular movements intact.     Pupils: Pupils are equal, round, and reactive to light.  Cardiovascular:     Rate and Rhythm: Normal rate and regular rhythm.     Heart sounds: No murmur heard. Pulmonary:     Effort: No respiratory distress.     Breath sounds: No wheezing or rales.  Abdominal:     General: There is no distension.     Tenderness: There is no abdominal tenderness.  Musculoskeletal:        General: Normal range of motion.     Cervical back: Normal range of motion.  Skin:    General: Skin is warm and dry.  Neurological:     General: No focal deficit present.     Mental Status: He is alert.     ED Results and Treatments Labs (all labs ordered are listed, but only abnormal results are displayed) Labs Reviewed  CBC WITH DIFFERENTIAL/PLATELET - Abnormal; Notable for the following components:      Result Value   Platelets 430 (*)    All other components within normal limits  BASIC METABOLIC PANEL WITH GFR - Abnormal; Notable for the following components:   Sodium 146 (*)    All other components within normal limits  RAPID URINE DRUG SCREEN, HOSP PERFORMED                                                                                                                          Radiology No results found.  Pertinent labs & imaging results that  were available during my care of the patient were reviewed by me and considered in my medical decision making (see MDM for details).  Medications Ordered in ED Medications  acetaminophen  (TYLENOL ) tablet 1,000 mg (1,000 mg Oral Given 05/08/24 2120)                                                                                                                                     Procedures Procedures  (including critical care time)  Medical Decision Making / ED Course   This patient presents to the ED for concern of drug injection, this involves an extensive number of treatment options, and is a complaint that carries  with it a high risk of complications and morbidity.  The differential diagnosis includes IV drug use, drug withdrawal, local skin infection  MDM: Patient seen emerged part for evaluation drug use.  Physical exam shows a small noninfected injection site in the left forearm but is otherwise unremarkable.  Pupils are normal.  Mental status normal.  There is no evidence of respiratory depression.  Patient injected 5 hours prior to arrival and I have very low suspicion that he is suffering from any sort of significant life-threatening drug overdose today.  Laboratory evaluation unremarkable.  Patient discharged in police custody.  Additional history obtained:  -External records from outside source obtained and reviewed including: Chart review including previous notes, labs, imaging, consultation notes   Lab Tests: -I ordered, reviewed, and interpreted labs.   The pertinent results include:   Labs Reviewed  CBC WITH DIFFERENTIAL/PLATELET - Abnormal; Notable for the following components:      Result Value   Platelets 430 (*)    All other components within normal limits  BASIC METABOLIC PANEL WITH GFR - Abnormal; Notable for the following components:   Sodium 146 (*)    All other components within normal limits  RAPID URINE DRUG SCREEN, HOSP PERFORMED     Medicines ordered and prescription drug management: Meds ordered this encounter  Medications   acetaminophen  (TYLENOL ) tablet 1,000 mg    -I have reviewed the patients home medicines and have made adjustments as needed  Critical interventions none    Social Determinants of Health:  Factors impacting patients care include: Currently in police custody   Reevaluation: After the interventions noted above, I reevaluated the patient and found that they have :stayed the same  Co morbidities that complicate the patient evaluation  Past Medical History:  Diagnosis Date   Asthma    Collar bone fracture    Migraines        Dispostion: I considered admission for this patient, but at this time he does not meet inpatient criteria for admission will be discharged with outpatient follow-up     Final Clinical Impression(s) / ED Diagnoses Final diagnoses:  Drug use     @PCDICTATION @    Albertina Dixon, MD 05/09/24 [phone]
